# Patient Record
Sex: Female | Born: 1980 | Race: Asian | Hispanic: No | Marital: Single | Smoking: Never smoker
Health system: Southern US, Community
[De-identification: ages and names within clinical notes are randomized; demographics above are authoritative.]

## PROBLEM LIST (undated history)

## (undated) DIAGNOSIS — M329 Systemic lupus erythematosus, unspecified: Secondary | ICD-10-CM

## (undated) DIAGNOSIS — I10 Essential (primary) hypertension: Secondary | ICD-10-CM

## (undated) DIAGNOSIS — M359 Systemic involvement of connective tissue, unspecified: Secondary | ICD-10-CM

---

## 2004-02-01 ENCOUNTER — Emergency Department (HOSPITAL_COMMUNITY): Admission: EM | Admit: 2004-02-01 | Discharge: 2004-02-01 | Payer: Self-pay | Admitting: Emergency Medicine

## 2004-05-27 ENCOUNTER — Emergency Department (HOSPITAL_COMMUNITY): Admission: EM | Admit: 2004-05-27 | Discharge: 2004-05-27 | Payer: Self-pay | Admitting: Emergency Medicine

## 2004-06-14 ENCOUNTER — Ambulatory Visit: Payer: Self-pay | Admitting: *Deleted

## 2004-06-14 ENCOUNTER — Ambulatory Visit: Payer: Self-pay | Admitting: Internal Medicine

## 2004-06-14 ENCOUNTER — Ambulatory Visit: Payer: Self-pay | Admitting: Family Medicine

## 2004-06-21 ENCOUNTER — Ambulatory Visit: Payer: Self-pay | Admitting: Family Medicine

## 2004-06-27 ENCOUNTER — Ambulatory Visit (HOSPITAL_COMMUNITY): Admission: RE | Admit: 2004-06-27 | Discharge: 2004-06-27 | Payer: Self-pay | Admitting: Family Medicine

## 2004-07-06 ENCOUNTER — Ambulatory Visit: Payer: Self-pay | Admitting: Family Medicine

## 2004-07-20 ENCOUNTER — Ambulatory Visit: Payer: Self-pay | Admitting: Family Medicine

## 2004-07-22 ENCOUNTER — Encounter (INDEPENDENT_AMBULATORY_CARE_PROVIDER_SITE_OTHER): Payer: Self-pay | Admitting: Family Medicine

## 2004-07-22 LAB — CONVERTED CEMR LAB: Pap Smear: NORMAL

## 2004-09-19 ENCOUNTER — Ambulatory Visit: Payer: Self-pay | Admitting: Family Medicine

## 2004-10-10 ENCOUNTER — Ambulatory Visit: Payer: Self-pay | Admitting: Family Medicine

## 2005-02-06 ENCOUNTER — Inpatient Hospital Stay (HOSPITAL_COMMUNITY): Admission: AD | Admit: 2005-02-06 | Discharge: 2005-02-06 | Payer: Self-pay | Admitting: *Deleted

## 2005-03-14 ENCOUNTER — Ambulatory Visit: Admission: RE | Admit: 2005-03-14 | Discharge: 2005-03-14 | Payer: Self-pay | Admitting: *Deleted

## 2005-03-15 ENCOUNTER — Ambulatory Visit: Payer: Self-pay | Admitting: *Deleted

## 2005-04-05 ENCOUNTER — Ambulatory Visit: Payer: Self-pay | Admitting: Family Medicine

## 2005-04-19 ENCOUNTER — Ambulatory Visit: Payer: Self-pay | Admitting: *Deleted

## 2005-05-10 ENCOUNTER — Ambulatory Visit: Payer: Self-pay | Admitting: Family Medicine

## 2005-05-19 ENCOUNTER — Ambulatory Visit (HOSPITAL_COMMUNITY): Admission: RE | Admit: 2005-05-19 | Discharge: 2005-05-19 | Payer: Self-pay | Admitting: *Deleted

## 2005-05-24 ENCOUNTER — Ambulatory Visit: Payer: Self-pay | Admitting: *Deleted

## 2005-05-24 ENCOUNTER — Ambulatory Visit (HOSPITAL_COMMUNITY): Admission: RE | Admit: 2005-05-24 | Discharge: 2005-05-24 | Payer: Self-pay | Admitting: *Deleted

## 2005-05-30 ENCOUNTER — Ambulatory Visit: Payer: Self-pay | Admitting: *Deleted

## 2005-05-31 ENCOUNTER — Ambulatory Visit: Payer: Self-pay | Admitting: *Deleted

## 2005-06-06 ENCOUNTER — Ambulatory Visit (HOSPITAL_COMMUNITY): Admission: RE | Admit: 2005-06-06 | Discharge: 2005-06-06 | Payer: Self-pay | Admitting: Obstetrics and Gynecology

## 2005-06-07 ENCOUNTER — Ambulatory Visit: Payer: Self-pay | Admitting: *Deleted

## 2005-06-14 ENCOUNTER — Ambulatory Visit: Payer: Self-pay | Admitting: *Deleted

## 2005-06-28 ENCOUNTER — Ambulatory Visit: Payer: Self-pay | Admitting: Obstetrics & Gynecology

## 2005-07-12 ENCOUNTER — Ambulatory Visit: Payer: Self-pay | Admitting: Obstetrics & Gynecology

## 2005-07-19 ENCOUNTER — Ambulatory Visit: Payer: Self-pay | Admitting: *Deleted

## 2005-07-26 ENCOUNTER — Ambulatory Visit: Payer: Self-pay | Admitting: *Deleted

## 2005-07-26 ENCOUNTER — Ambulatory Visit (HOSPITAL_COMMUNITY): Admission: RE | Admit: 2005-07-26 | Discharge: 2005-07-26 | Payer: Self-pay | Admitting: *Deleted

## 2005-07-30 ENCOUNTER — Ambulatory Visit: Payer: Self-pay | Admitting: Obstetrics and Gynecology

## 2005-07-30 ENCOUNTER — Inpatient Hospital Stay (HOSPITAL_COMMUNITY): Admission: AD | Admit: 2005-07-30 | Discharge: 2005-08-01 | Payer: Self-pay | Admitting: *Deleted

## 2006-04-16 ENCOUNTER — Emergency Department (HOSPITAL_COMMUNITY): Admission: EM | Admit: 2006-04-16 | Discharge: 2006-04-16 | Payer: Self-pay | Admitting: Emergency Medicine

## 2006-06-07 ENCOUNTER — Ambulatory Visit: Payer: Self-pay | Admitting: Family Medicine

## 2006-06-24 IMAGING — CR DG FEET 3 VIEWS BILAT
6 series · 6 of 6 positions shown · non-contrast
Comparison: none

CLINICAL DATA: Pain without trauma.  
 LEFT FOOT (THREE VIEWS)

[view not recorded (1 of 6)]
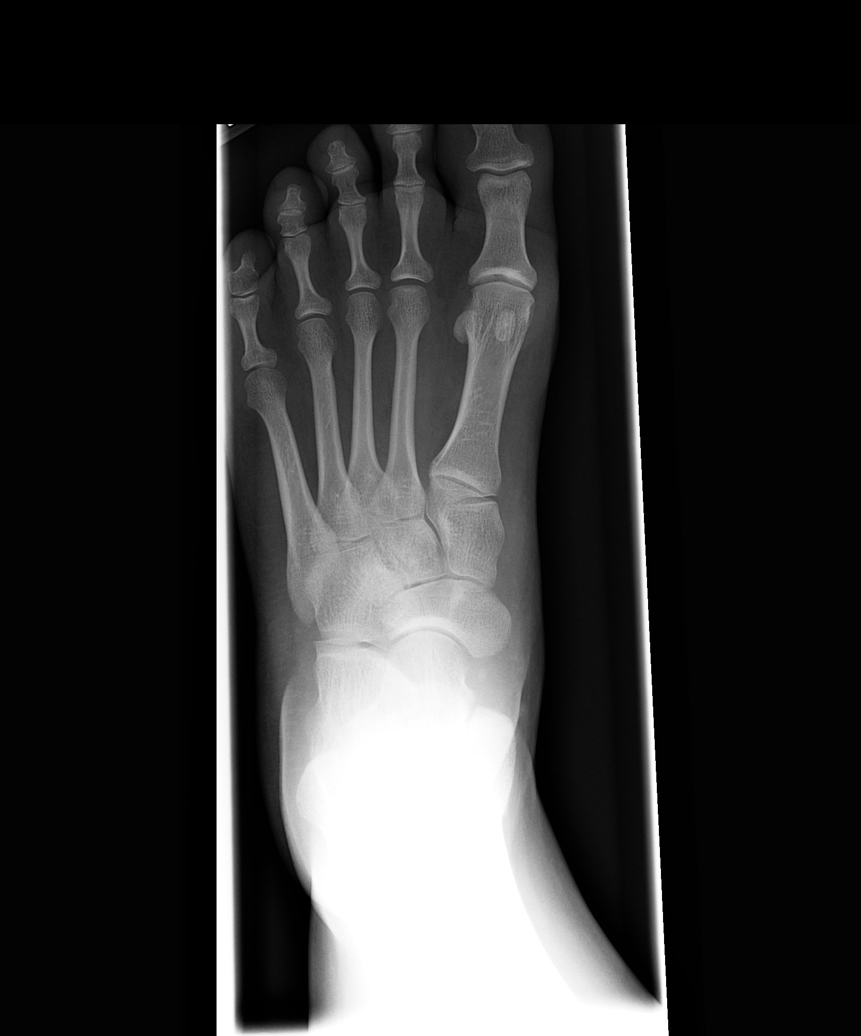

[view not recorded (2 of 6)]
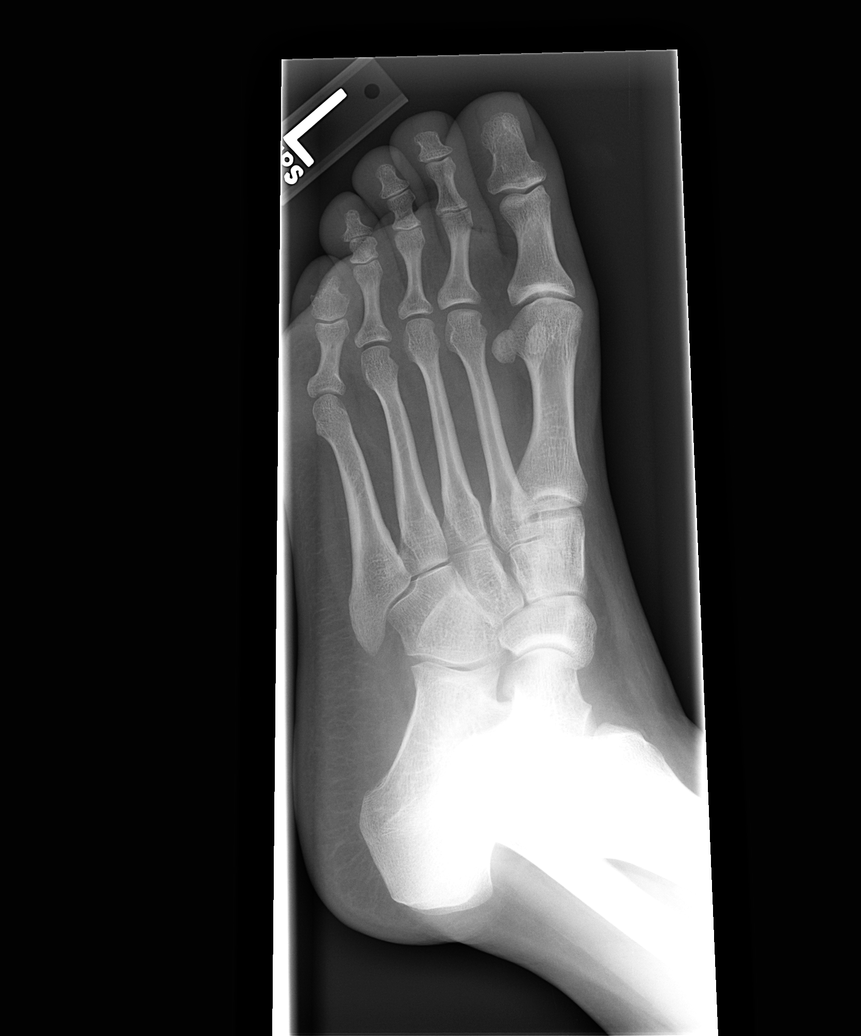

[view not recorded (3 of 6)]
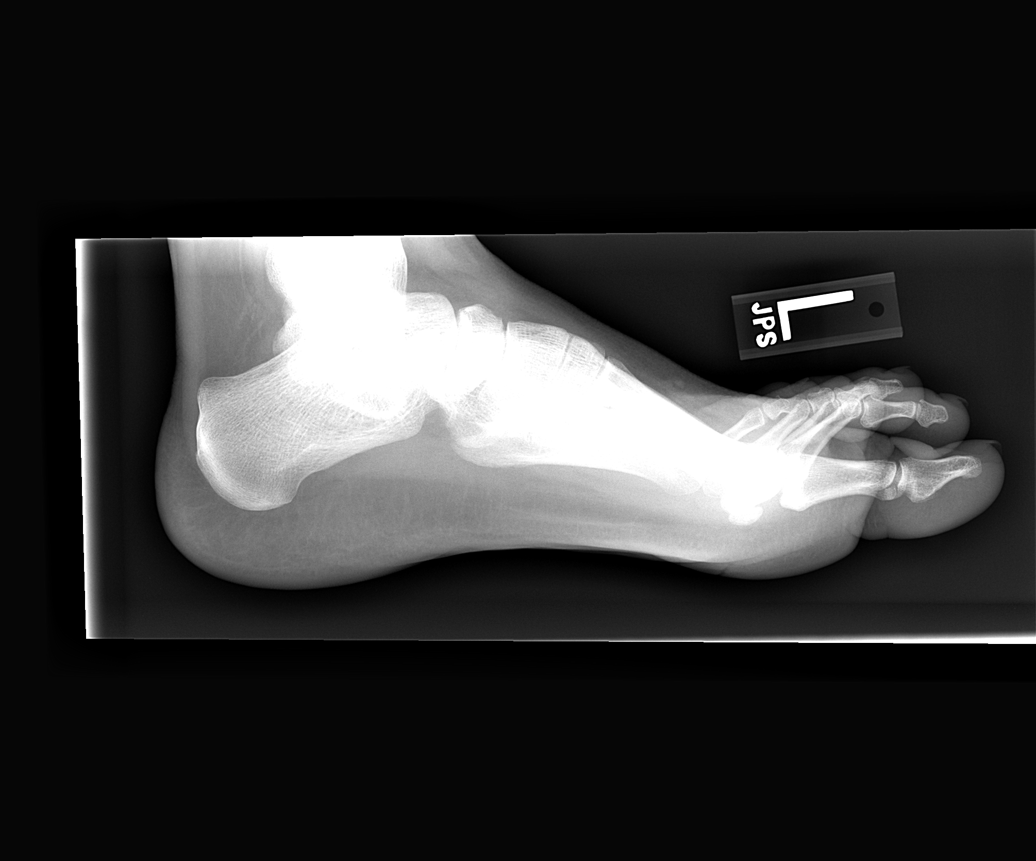

[view not recorded (4 of 6)]
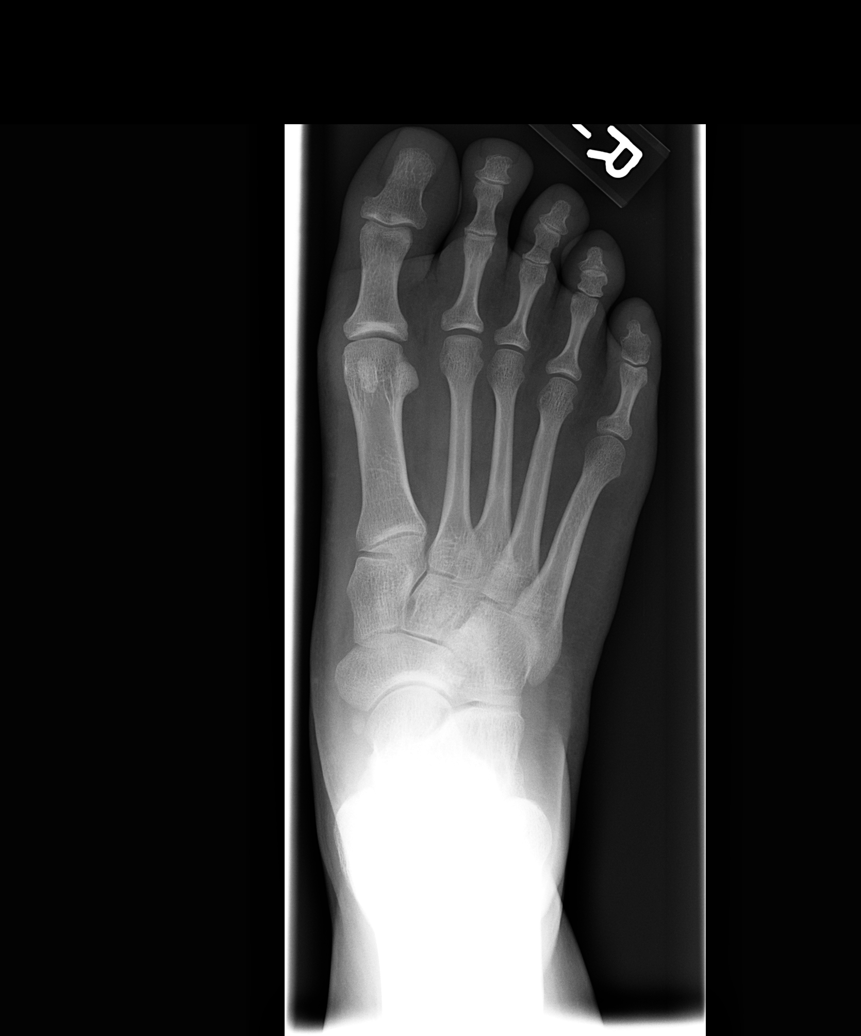

[view not recorded (5 of 6)]
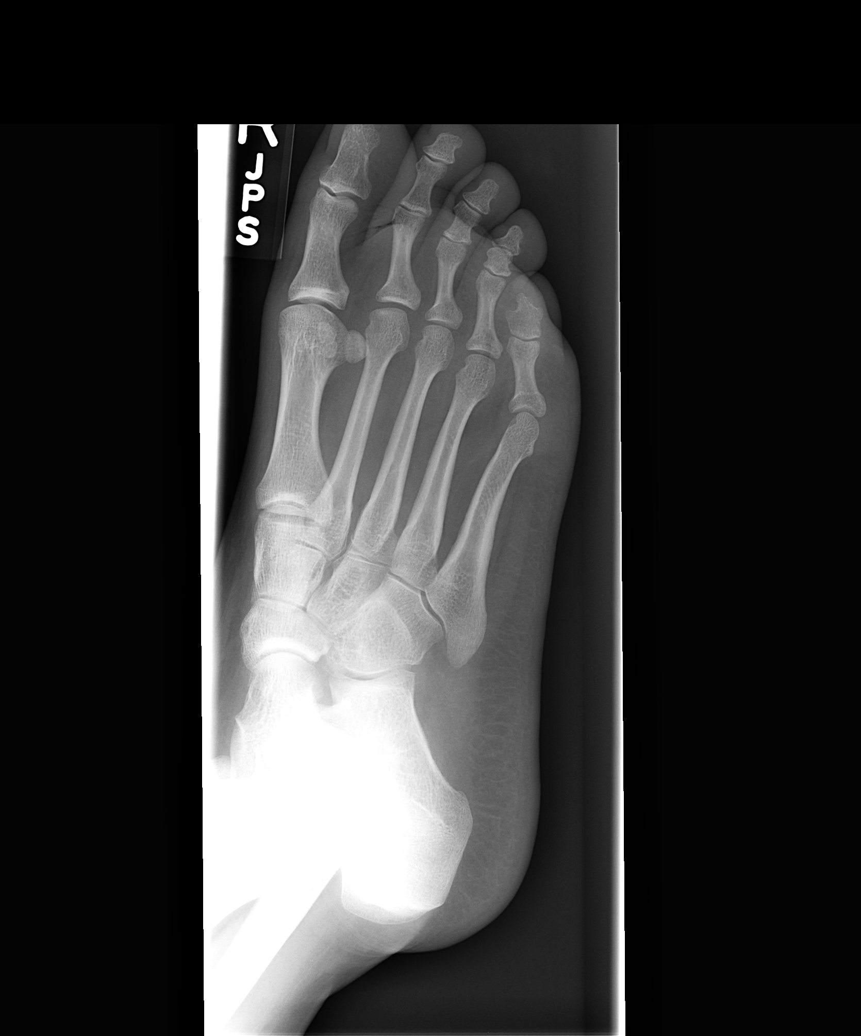

[view not recorded (6 of 6)]
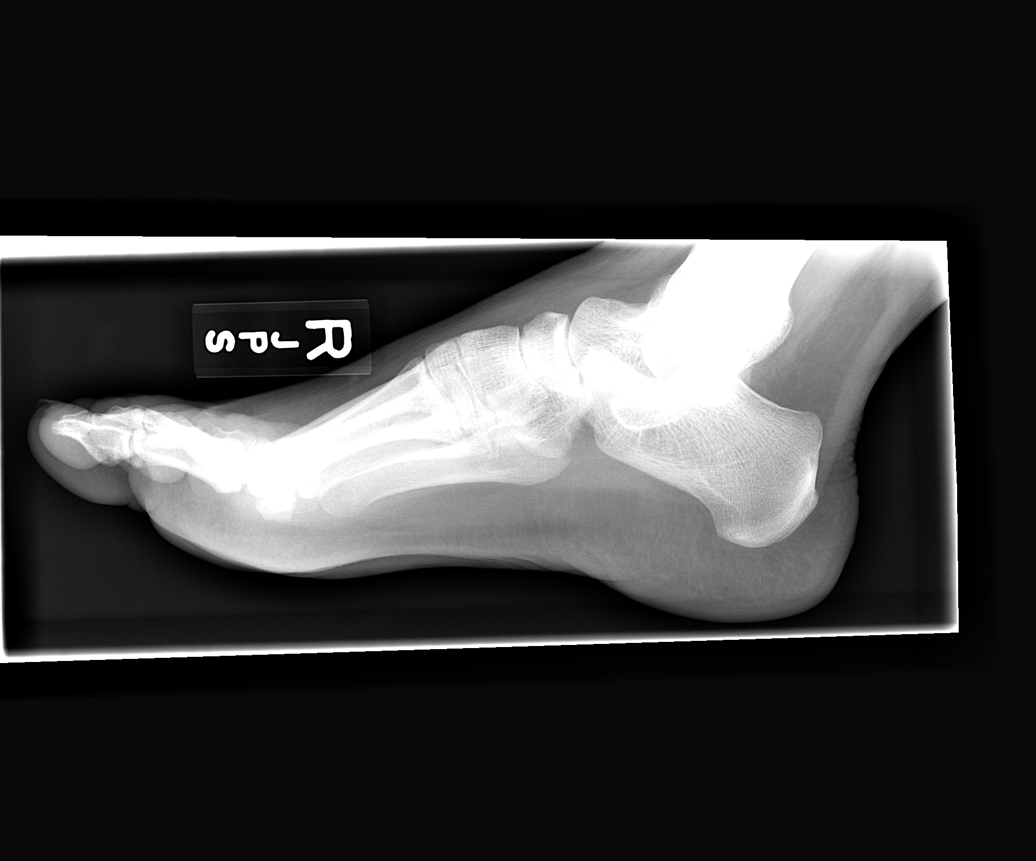

[6 of 6 positions shown; findings below may reference images not displayed]

FINDINGS: There is no evidence of fracture or dislocation.  No other significant bone or soft tissue abnormalities are identified.  The joint spaces are within normal limits.  
 IMPRESSION
 Normal study.    
 RIGHT FOOT (THREE VIEWS)
 There is no evidence of fracture or dislocation. No other significant bone or soft tissue abnormalities are identified. The joints spaces are within normal limits.  
 IMPRESSION
 Normal study.

## 2006-07-11 ENCOUNTER — Ambulatory Visit: Payer: Self-pay | Admitting: Family Medicine

## 2006-08-27 ENCOUNTER — Ambulatory Visit: Payer: Self-pay | Admitting: Family Medicine

## 2007-05-01 ENCOUNTER — Encounter (INDEPENDENT_AMBULATORY_CARE_PROVIDER_SITE_OTHER): Payer: Self-pay | Admitting: Family Medicine

## 2007-10-23 ENCOUNTER — Encounter (INDEPENDENT_AMBULATORY_CARE_PROVIDER_SITE_OTHER): Payer: Self-pay | Admitting: Family Medicine

## 2007-11-26 ENCOUNTER — Ambulatory Visit (HOSPITAL_COMMUNITY): Admission: RE | Admit: 2007-11-26 | Discharge: 2007-11-26 | Payer: Self-pay | Admitting: Family Medicine

## 2008-02-11 ENCOUNTER — Encounter (INDEPENDENT_AMBULATORY_CARE_PROVIDER_SITE_OTHER): Payer: Self-pay | Admitting: Family Medicine

## 2008-03-13 ENCOUNTER — Emergency Department (HOSPITAL_COMMUNITY): Admission: EM | Admit: 2008-03-13 | Discharge: 2008-03-13 | Payer: Self-pay

## 2008-03-13 ENCOUNTER — Emergency Department (HOSPITAL_COMMUNITY): Admission: EM | Admit: 2008-03-13 | Discharge: 2008-03-13 | Payer: Self-pay | Admitting: Emergency Medicine

## 2008-06-29 ENCOUNTER — Encounter (INDEPENDENT_AMBULATORY_CARE_PROVIDER_SITE_OTHER): Payer: Self-pay | Admitting: Family Medicine

## 2008-06-29 ENCOUNTER — Telehealth (INDEPENDENT_AMBULATORY_CARE_PROVIDER_SITE_OTHER): Payer: Self-pay | Admitting: *Deleted

## 2008-07-07 ENCOUNTER — Encounter (INDEPENDENT_AMBULATORY_CARE_PROVIDER_SITE_OTHER): Payer: Self-pay | Admitting: *Deleted

## 2011-05-19 LAB — URINALYSIS, ROUTINE W REFLEX MICROSCOPIC
Bilirubin Urine: NEGATIVE
Glucose, UA: NEGATIVE
Hgb urine dipstick: NEGATIVE
Ketones, ur: NEGATIVE
Nitrite: NEGATIVE
Protein, ur: NEGATIVE
Specific Gravity, Urine: 1.024
Urobilinogen, UA: 0.2
pH: 5.5

## 2011-05-19 LAB — DIFFERENTIAL
Basophils Absolute: 0
Basophils Relative: 0
Eosinophils Absolute: 0
Eosinophils Relative: 0
Lymphocytes Relative: 37
Lymphs Abs: 2.4
Monocytes Absolute: 0.4
Monocytes Relative: 6
Neutro Abs: 3.7
Neutrophils Relative %: 57

## 2011-05-19 LAB — CBC
HCT: 39.4
Hemoglobin: 12.8
MCHC: 32.4
MCV: 79.4
Platelets: 187
RBC: 4.96
RDW: 13.9
WBC: 6.5

## 2011-05-19 LAB — POCT I-STAT, CHEM 8
BUN: 13
Calcium, Ion: 1.09 — ABNORMAL LOW
Chloride: 106
Creatinine, Ser: 0.9
Glucose, Bld: 79
HCT: 42
Hemoglobin: 14.3
Potassium: 3.2 — ABNORMAL LOW
Sodium: 140
TCO2: 24

## 2011-05-19 LAB — POCT PREGNANCY, URINE
Operator id: 234501
Preg Test, Ur: NEGATIVE

## 2011-05-19 LAB — D-DIMER, QUANTITATIVE: D-Dimer, Quant: 0.3

## 2014-10-21 DIAGNOSIS — M329 Systemic lupus erythematosus, unspecified: Secondary | ICD-10-CM | POA: Insufficient documentation

## 2015-01-23 ENCOUNTER — Emergency Department
Admission: EM | Admit: 2015-01-23 | Discharge: 2015-01-23 | Disposition: A | Discharge status: Other Acute Inpatient Hospital | Payer: Self-pay | Attending: Student | Admitting: Student

## 2015-01-23 ENCOUNTER — Encounter: Payer: Self-pay | Admitting: Emergency Medicine

## 2015-01-23 ENCOUNTER — Emergency Department: Payer: Self-pay

## 2015-01-23 DIAGNOSIS — Z7952 Long term (current) use of systemic steroids: Secondary | ICD-10-CM | POA: Insufficient documentation

## 2015-01-23 DIAGNOSIS — Y834 Other reconstructive surgery as the cause of abnormal reaction of the patient, or of later complication, without mention of misadventure at the time of the procedure: Secondary | ICD-10-CM | POA: Insufficient documentation

## 2015-01-23 DIAGNOSIS — N9961 Intraoperative hemorrhage and hematoma of a genitourinary system organ or structure complicating a genitourinary system procedure: Secondary | ICD-10-CM | POA: Insufficient documentation

## 2015-01-23 DIAGNOSIS — Z79899 Other long term (current) drug therapy: Secondary | ICD-10-CM | POA: Insufficient documentation

## 2015-01-23 DIAGNOSIS — R109 Unspecified abdominal pain: Secondary | ICD-10-CM

## 2015-01-23 DIAGNOSIS — S37012A Minor contusion of left kidney, initial encounter: Secondary | ICD-10-CM

## 2015-01-23 DIAGNOSIS — Z9889 Other specified postprocedural states: Secondary | ICD-10-CM | POA: Insufficient documentation

## 2015-01-23 HISTORY — DX: Systemic lupus erythematosus, unspecified: M32.9

## 2015-01-23 LAB — CBC WITH DIFFERENTIAL/PLATELET
Basophils Absolute: 0 10*3/uL (ref 0–0.1)
Basophils Relative: 0 %
Eosinophils Absolute: 0 10*3/uL (ref 0–0.7)
Eosinophils Relative: 0 %
HCT: 33.5 % — ABNORMAL LOW (ref 35.0–47.0)
Hemoglobin: 10.6 g/dL — ABNORMAL LOW (ref 12.0–16.0)
Lymphocytes Relative: 9 %
Lymphs Abs: 0.7 10*3/uL — ABNORMAL LOW (ref 1.0–3.6)
MCH: 23.9 pg — ABNORMAL LOW (ref 26.0–34.0)
MCHC: 31.7 g/dL — ABNORMAL LOW (ref 32.0–36.0)
MCV: 75.3 fL — ABNORMAL LOW (ref 80.0–100.0)
Monocytes Absolute: 0.2 10*3/uL (ref 0.2–0.9)
Monocytes Relative: 3 %
Neutro Abs: 6.6 10*3/uL — ABNORMAL HIGH (ref 1.4–6.5)
Neutrophils Relative %: 88 %
Platelets: 144 10*3/uL — ABNORMAL LOW (ref 150–440)
RBC: 4.45 MIL/uL (ref 3.80–5.20)
RDW: 16.2 % — ABNORMAL HIGH (ref 11.5–14.5)
WBC: 7.5 10*3/uL (ref 3.6–11.0)

## 2015-01-23 LAB — COMPREHENSIVE METABOLIC PANEL
ALBUMIN: 3.9 g/dL (ref 3.5–5.0)
ALT: 24 U/L (ref 14–54)
AST: 32 U/L (ref 15–41)
Alkaline Phosphatase: 64 U/L (ref 38–126)
Anion gap: 8 (ref 5–15)
BUN: 19 mg/dL (ref 6–20)
CALCIUM: 8.9 mg/dL (ref 8.9–10.3)
CO2: 24 mmol/L (ref 22–32)
Chloride: 102 mmol/L (ref 101–111)
Creatinine, Ser: 1.16 mg/dL — ABNORMAL HIGH (ref 0.44–1.00)
GFR calc Af Amer: >60 mL/min (ref >60)
GFR calc non Af Amer: >60 mL/min (ref >60)
GLUCOSE: 99 mg/dL (ref 65–99)
Potassium: 4.2 mmol/L (ref 3.5–5.1)
Sodium: 134 mmol/L — ABNORMAL LOW (ref 135–145)
Total Bilirubin: 0.8 mg/dL (ref 0.3–1.2)
Total Protein: 9.1 g/dL — ABNORMAL HIGH (ref 6.5–8.1)

## 2015-01-23 LAB — URINALYSIS COMPLETE WITH MICROSCOPIC (ARMC ONLY)
Bilirubin Urine: NEGATIVE
Glucose, UA: NEGATIVE mg/dL
Ketones, ur: NEGATIVE mg/dL
Leukocytes, UA: NEGATIVE
NITRITE: NEGATIVE
PH: 7 (ref 5.0–8.0)
Specific Gravity, Urine: 1.018 (ref 1.005–1.030)

## 2015-01-23 LAB — LIPASE, BLOOD: Lipase: 32 U/L (ref 22–51)

## 2015-01-23 LAB — PREGNANCY, URINE: PREG TEST UR: NEGATIVE

## 2015-01-23 MED ORDER — SODIUM CHLORIDE 0.9 % IV BOLUS (SEPSIS)
1000.0000 mL | Freq: Once | INTRAVENOUS | Status: AC
  Administered 2015-01-23: 1000 mL via INTRAVENOUS

## 2015-01-23 MED ORDER — HYDROMORPHONE HCL 1 MG/ML IJ SOLN
INTRAMUSCULAR | Status: AC
  Administered 2015-01-23: 1 mg via INTRAVENOUS
  Filled 2015-01-23: qty 1

## 2015-01-23 MED ORDER — MORPHINE SULFATE 4 MG/ML IJ SOLN
4.0000 mg | Freq: Once | INTRAMUSCULAR | Status: AC
  Administered 2015-01-23: 4 mg via INTRAVENOUS

## 2015-01-23 MED ORDER — IOHEXOL 240 MG/ML SOLN
25.0000 mL | Freq: Once | INTRAMUSCULAR | Status: AC | PRN
  Administered 2015-01-23: 25 mL via ORAL

## 2015-01-23 MED ORDER — HYDROMORPHONE HCL 1 MG/ML IJ SOLN
1.0000 mg | Freq: Once | INTRAMUSCULAR | Status: AC
  Administered 2015-01-23: 1 mg via INTRAVENOUS

## 2015-01-23 MED ORDER — ONDANSETRON HCL 4 MG/2ML IJ SOLN
INTRAMUSCULAR | Status: AC
  Administered 2015-01-23: 4 mg via INTRAVENOUS
  Filled 2015-01-23: qty 2

## 2015-01-23 MED ORDER — MORPHINE SULFATE 4 MG/ML IJ SOLN
INTRAMUSCULAR | Status: AC
  Administered 2015-01-23: 4 mg via INTRAVENOUS
  Filled 2015-01-23: qty 1

## 2015-01-23 MED ORDER — IOHEXOL 300 MG/ML  SOLN
100.0000 mL | Freq: Once | INTRAMUSCULAR | Status: AC | PRN
  Administered 2015-01-23: 85 mL via INTRAVENOUS

## 2015-01-23 MED ORDER — ONDANSETRON HCL 4 MG/2ML IJ SOLN
4.0000 mg | Freq: Once | INTRAMUSCULAR | Status: AC
  Administered 2015-01-23: 4 mg via INTRAVENOUS

## 2015-01-23 NOTE — ED Notes (Signed)
Patient transported to CT 

## 2015-01-23 NOTE — ED Provider Notes (Signed)
[facility] Emergency Department Provider Note  ____________________________________________  Time seen: Approximately 4:17 PM  I have reviewed the triage vital signs and the nursing notes.   HISTORY  Chief Complaint Flank Pain    HPI Angela Li is a 34 y.o. female with history of SLE, chronic LFT elevation, status post left renal biopsy on 4/0/9811 complicated by renal hematoma at [facility] who presents with sudden onset left flank and left abdominal pain this morning. Pain has been constant since onset. It is described as dull. It is associated with nausea. Movement makes the pain worse. No vomiting or diarrhea. No fevers or chills. Chest pain or difficulty breathing. Currently her pain is moderate.   Past Medical History  Diagnosis Date  . Lupus     There are no active problems to display for this patient.   History reviewed. No pertinent past surgical history.  Current Outpatient Rx  Name  Route  Sig  Dispense  Refill  . acetaminophen (TYLENOL) 325 MG tablet   Oral   Take 650 mg by mouth every 4 (four) hours as needed.         . Cholecalciferol (VITAMIN D3) 1000 UNITS CAPS   Oral   Take 1,000 Int'l Units by mouth daily.         . hydroxychloroquine (PLAQUENIL) 200 MG tablet   Oral   Take 200 mg by mouth 2 (two) times daily.         . irbesartan (AVAPRO) 150 MG tablet   Oral   Take 75 mg by mouth at bedtime. Start 1 week after kidney biopsy         . naproxen sodium (ANAPROX) 220 MG tablet   Oral   Take 440 mg by mouth every 6 (six) hours as needed (for pain.).         Marland Kitchen pantoprazole (PROTONIX) 40 MG tablet   Oral   Take 40 mg by mouth daily as needed.         . predniSONE (DELTASONE) 5 MG tablet   Oral   Take 5 mg by mouth daily.           Allergies Review of patient's allergies indicates no known allergies.  History reviewed. No pertinent family history.  Social History History  Substance Use Topics  . Smoking  status: Never Smoker   . Smokeless tobacco: Not on file  . Alcohol Use: No    Review of Systems Constitutional: No fever/chills Eyes: No visual changes. ENT: No sore throat. Cardiovascular: Denies chest pain. Respiratory: Denies shortness of breath. Gastrointestinal: + abdominal pain.  +nausea, no vomiting.  No diarrhea.  No constipation. Genitourinary: Negative for dysuria. Musculoskeletal: + for left flank pain. Skin: Negative for rash. Neurological: Negative for headaches, focal weakness or numbness.  10-point ROS otherwise negative.  ____________________________________________   PHYSICAL EXAM:  VITAL SIGNS: ED Triage Vitals  Enc Vitals Group     BP --      Pulse Rate 01/23/15 1223 81     Resp 01/23/15 1223 20     Temp 01/23/15 1223 97.8 F (36.6 C)     Temp Source 01/23/15 1223 Oral     SpO2 01/23/15 1223 100 %     Weight 01/23/15 1223 151 lb (68.493 kg)     Height 01/23/15 1223 _0  (1.651 m)     Head Cir --      Peak Flow --      Pain Score 01/23/15 1224 10  Pain Loc --      Pain Edu? --      Excl. in [place]? --     Constitutional: Alert and oriented. Well appearing and in no acute distress until she moves at which point she appears to be in pain. Eyes: Conjunctivae are normal. PERRL. EOMI. Head: Atraumatic. Nose: No congestion/rhinnorhea. Mouth/Throat: Mucous membranes are moist.  Oropharynx non-erythematous. Neck: No stridor.   Cardiovascular: Normal rate, regular rhythm. Grossly normal heart sounds.  Good peripheral circulation. Respiratory: Normal respiratory effort.  No retractions. Lungs CTAB. Gastrointestinal: Soft with moderate to severe left lower quadrant and left mid abdominal tenderness. No distention. No abdominal bruits. Small bruise with healed small caliber puncture wound from needle biopsy in the left flank. Genitourinary: deferred Musculoskeletal: No lower extremity tenderness nor edema.  No joint effusions. Neurologic:  Normal speech  and language. No gross focal neurologic deficits are appreciated. Speech is normal. No gait instability. Skin:  Skin is warm, dry and intact. No rash noted. Psychiatric: Mood and affect are normal. Speech and behavior are normal.  ____________________________________________   LABS (all labs ordered are listed, but only abnormal results are displayed)  Labs Reviewed  CBC WITH DIFFERENTIAL/PLATELET - Abnormal; Notable for the following:    Hemoglobin 10.6 (*)    HCT 33.5 (*)    MCV 75.3 (*)    MCH 23.9 (*)    MCHC 31.7 (*)    RDW 16.2 (*)    Platelets 144 (*)    Neutro Abs 6.6 (*)    Lymphs Abs 0.7 (*)    All other components within normal limits  COMPREHENSIVE METABOLIC PANEL - Abnormal; Notable for the following:    Sodium 134 (*)    Creatinine, Ser 1.16 (*)    Total Protein 9.1 (*)    All other components within normal limits  URINALYSIS COMPLETEWITH MICROSCOPIC ([facility] ONLY) - Abnormal; Notable for the following:    Color, Urine YELLOW (*)    APPearance CLEAR (*)    Hgb urine dipstick 1+ (*)    Protein, ur >500 (*)    Bacteria, UA RARE (*)    Squamous Epithelial / LPF 0-5 (*)    All other components within normal limits  LIPASE, BLOOD  PREGNANCY, URINE   ____________________________________________  EKG  none ____________________________________________  RADIOLOGY  CT Abdomen and Pelvis IMPRESSION: Large subcapsular hematoma LEFT kidney 7.9 x 7.6 x 10.9 cm in size with additional blood in the LEFT perinephric and posterior para renal spaces. ____________________________________________   PROCEDURES  Procedure(s) performed: None  Critical Care performed: No  ____________________________________________   INITIAL IMPRESSION / ASSESSMENT AND PLAN / ED COURSE  Pertinent labs & imaging results that were available during my care of the patient were reviewed by me and considered in my medical decision making (see chart for details).  Angela Li is a 34  y.o. female with history of SLE, chronic LFT elevation, status post left renal biopsy on 03/24/4195 complicated by renal hematoma at [facility] who presents with sudden onset left flank and left abdominal pain this morning. On exam, vital signs stable and she is afebrile. Labs compared to labs obtained at [facility] 2 days ago show stable hemoglobin at approximately 10. As she has a known renal hematoma and given new onset left lower quadrant tenderness, we'll obtain CT of the abdomen and pelvis to evaluate for any additional complication associated with the patient's biopsy. We'll give IV fluids, treat her pain.  ----------------------------------------- 8:00 PM on 01/23/2015 -----------------------------------------  CT  scan shows hematoma which has increased in size when compared to ultrasound on 01/21/2015. Discussed with patient need for admission and she agrees, the best course of care is to send her back to [facility]. Pain is improving with Dilaudid. Vital signs stable. Discussed with Dr .Johnney Ou of Nephrology who recommended ER to ER transfer. Discussed with Dr. Patrici Ranks, ER attending who will accept transfer. ____________________________________________   FINAL CLINICAL IMPRESSION(S) / ED DIAGNOSES  Final diagnoses:  Renal hematoma, left, initial encounter  Flank pain      Joanne Gavel, MD 01/23/15 2004

## 2015-01-23 NOTE — ED Notes (Signed)
Report to Marguarite Arbour, Insurance risk surveyor Nurse at [facility] ER.

## 2015-01-23 NOTE — ED Notes (Signed)
Kidney biopsy on Wednesday.  Right flank pain onset this am

## 2015-01-25 DIAGNOSIS — D696 Thrombocytopenia, unspecified: Secondary | ICD-10-CM | POA: Insufficient documentation

## 2016-09-01 ENCOUNTER — Encounter: Payer: Self-pay | Admitting: Emergency Medicine

## 2016-09-01 ENCOUNTER — Emergency Department: Payer: Medicaid Other

## 2016-09-01 ENCOUNTER — Inpatient Hospital Stay
Admission: EM | Admit: 2016-09-01 | Discharge: 2016-09-02 | DRG: 546 | Disposition: A | Discharge status: Other Acute Inpatient Hospital | Payer: Medicaid Other | Attending: Internal Medicine | Admitting: Internal Medicine

## 2016-09-01 ENCOUNTER — Observation Stay
Admit: 2016-09-01 | Discharge: 2016-09-01 | Disposition: A | Discharge status: Home / Self Care | Payer: Medicaid Other | Attending: Internal Medicine | Admitting: Internal Medicine

## 2016-09-01 DIAGNOSIS — M3214 Glomerular disease in systemic lupus erythematosus: Secondary | ICD-10-CM | POA: Diagnosis present

## 2016-09-01 DIAGNOSIS — E872 Acidosis: Secondary | ICD-10-CM | POA: Diagnosis present

## 2016-09-01 DIAGNOSIS — N179 Acute kidney failure, unspecified: Secondary | ICD-10-CM | POA: Diagnosis present

## 2016-09-01 DIAGNOSIS — Z79899 Other long term (current) drug therapy: Secondary | ICD-10-CM

## 2016-09-01 DIAGNOSIS — I272 Pulmonary hypertension, unspecified: Secondary | ICD-10-CM | POA: Diagnosis present

## 2016-09-01 DIAGNOSIS — N19 Unspecified kidney failure: Secondary | ICD-10-CM | POA: Diagnosis present

## 2016-09-01 DIAGNOSIS — I10 Essential (primary) hypertension: Secondary | ICD-10-CM | POA: Diagnosis present

## 2016-09-01 DIAGNOSIS — R188 Other ascites: Secondary | ICD-10-CM | POA: Diagnosis present

## 2016-09-01 DIAGNOSIS — I309 Acute pericarditis, unspecified: Secondary | ICD-10-CM | POA: Diagnosis present

## 2016-09-01 DIAGNOSIS — M3212 Pericarditis in systemic lupus erythematosus: Secondary | ICD-10-CM

## 2016-09-01 DIAGNOSIS — E875 Hyperkalemia: Secondary | ICD-10-CM | POA: Diagnosis present

## 2016-09-01 DIAGNOSIS — J90 Pleural effusion, not elsewhere classified: Secondary | ICD-10-CM | POA: Diagnosis present

## 2016-09-01 DIAGNOSIS — I319 Disease of pericardium, unspecified: Secondary | ICD-10-CM

## 2016-09-01 HISTORY — DX: Essential (primary) hypertension: I10

## 2016-09-01 HISTORY — DX: Systemic involvement of connective tissue, unspecified: M35.9

## 2016-09-01 LAB — CBC
HCT: 47.5 % — ABNORMAL HIGH (ref 35.0–47.0)
Hemoglobin: 15.3 g/dL (ref 12.0–16.0)
MCH: 24.5 pg — ABNORMAL LOW (ref 26.0–34.0)
MCHC: 32.3 g/dL (ref 32.0–36.0)
MCV: 75.9 fL — AB (ref 80.0–100.0)
Platelets: 100 10*3/uL — ABNORMAL LOW (ref 150–440)
RBC: 6.26 MIL/uL — ABNORMAL HIGH (ref 3.80–5.20)
RDW: 25.2 % — ABNORMAL HIGH (ref 11.5–14.5)
WBC: 8.4 10*3/uL (ref 3.6–11.0)

## 2016-09-01 LAB — INFLUENZA PANEL BY PCR (TYPE A & B)
INFLBPCR: NEGATIVE
Influenza A By PCR: NEGATIVE

## 2016-09-01 LAB — ECHOCARDIOGRAM COMPLETE
HEIGHTINCHES: 65 in
Weight: 2395.2 oz

## 2016-09-01 LAB — BASIC METABOLIC PANEL
ANION GAP: 6 (ref 5–15)
BUN: 26 mg/dL — ABNORMAL HIGH (ref 6–20)
CO2: 17 mmol/L — ABNORMAL LOW (ref 22–32)
Calcium: 7.6 mg/dL — ABNORMAL LOW (ref 8.9–10.3)
Chloride: 113 mmol/L — ABNORMAL HIGH (ref 101–111)
Creatinine, Ser: 1.18 mg/dL — ABNORMAL HIGH (ref 0.44–1.00)
GFR calc Af Amer: >60 mL/min (ref >60)
GFR, EST NON AFRICAN AMERICAN: 59 mL/min — AB (ref >60)
GLUCOSE: 97 mg/dL (ref 65–99)
POTASSIUM: 5 mmol/L (ref 3.5–5.1)
Sodium: 136 mmol/L (ref 135–145)

## 2016-09-01 LAB — TROPONIN I
TROPONIN I: 0.14 ng/mL — AB (ref <0.03)
TROPONIN I: 0.13 ng/mL — AB (ref <0.03)
Troponin I: 0.07 ng/mL (ref <0.03)
Troponin I: 0.15 ng/mL (ref <0.03)

## 2016-09-01 MED ORDER — PROCHLORPERAZINE EDISYLATE 5 MG/ML IJ SOLN
5.0000 mg | INTRAMUSCULAR | Status: DC | PRN
  Administered 2016-09-02: 5 mg via INTRAVENOUS
  Filled 2016-09-01 (×2): qty 2

## 2016-09-01 MED ORDER — PREDNISONE 10 MG PO TABS
5.0000 mg | ORAL_TABLET | Freq: Two times a day (BID) | ORAL | Status: DC
  Administered 2016-09-01: 5 mg via ORAL
  Filled 2016-09-01: qty 2

## 2016-09-01 MED ORDER — ONDANSETRON HCL 4 MG/2ML IJ SOLN
INTRAMUSCULAR | Status: AC
Start: 2016-09-01 — End: 2016-09-01
  Administered 2016-09-01: 4 mg
  Filled 2016-09-01: qty 2

## 2016-09-01 MED ORDER — VITAMIN D 1000 UNITS PO TABS
1000.0000 [IU] | ORAL_TABLET | Freq: Every day | ORAL | Status: DC
  Administered 2016-09-01: 1000 [IU] via ORAL
  Filled 2016-09-01: qty 1

## 2016-09-01 MED ORDER — ASPIRIN 81 MG PO CHEW
CHEWABLE_TABLET | ORAL | Status: AC
  Administered 2016-09-01: 324 mg via ORAL
  Filled 2016-09-01: qty 4

## 2016-09-01 MED ORDER — PANTOPRAZOLE SODIUM 40 MG PO TBEC
40.0000 mg | DELAYED_RELEASE_TABLET | Freq: Two times a day (BID) | ORAL | Status: DC
  Administered 2016-09-01: 40 mg via ORAL
  Filled 2016-09-01 (×2): qty 1

## 2016-09-01 MED ORDER — IOPAMIDOL (ISOVUE-370) INJECTION 76%
75.0000 mL | Freq: Once | INTRAVENOUS | Status: AC | PRN
  Administered 2016-09-01: 75 mL via INTRAVENOUS

## 2016-09-01 MED ORDER — SODIUM CHLORIDE 0.9% FLUSH
3.0000 mL | Freq: Two times a day (BID) | INTRAVENOUS | Status: DC
  Administered 2016-09-01 – 2016-09-02 (×3): 3 mL via INTRAVENOUS

## 2016-09-01 MED ORDER — ASPIRIN 81 MG PO CHEW
324.0000 mg | CHEWABLE_TABLET | Freq: Once | ORAL | Status: AC
  Administered 2016-09-01: 324 mg via ORAL

## 2016-09-01 MED ORDER — ALBUTEROL SULFATE (2.5 MG/3ML) 0.083% IN NEBU
5.0000 mg | INHALATION_SOLUTION | Freq: Once | RESPIRATORY_TRACT | Status: AC
  Administered 2016-09-01: 5 mg via RESPIRATORY_TRACT
  Filled 2016-09-01: qty 6

## 2016-09-01 MED ORDER — INDOMETHACIN ER 75 MG PO CPCR
75.0000 mg | ORAL_CAPSULE | Freq: Two times a day (BID) | ORAL | Status: DC
  Administered 2016-09-01: 75 mg via ORAL
  Filled 2016-09-01 (×2): qty 1

## 2016-09-01 MED ORDER — KETOROLAC TROMETHAMINE 30 MG/ML IJ SOLN
30.0000 mg | Freq: Once | INTRAMUSCULAR | Status: AC
Start: 2016-09-01 — End: 2016-09-01
  Administered 2016-09-01: 30 mg via INTRAVENOUS
  Filled 2016-09-01 (×2): qty 1

## 2016-09-01 MED ORDER — ONDANSETRON HCL 4 MG/2ML IJ SOLN: 4.0000 mg | Freq: Four times a day (QID) | INTRAMUSCULAR | Status: DC | PRN

## 2016-09-01 MED ORDER — PANTOPRAZOLE SODIUM 40 MG IV SOLR
40.0000 mg | Freq: Once | INTRAVENOUS | Status: AC
  Administered 2016-09-01: 40 mg via INTRAVENOUS
  Filled 2016-09-01: qty 40

## 2016-09-01 MED ORDER — ONDANSETRON HCL 4 MG/2ML IJ SOLN
4.0000 mg | Freq: Four times a day (QID) | INTRAMUSCULAR | Status: DC
  Administered 2016-09-01: 4 mg via INTRAVENOUS

## 2016-09-01 MED ORDER — ACETAMINOPHEN 325 MG PO TABS: 650.0000 mg | ORAL_TABLET | ORAL | Status: DC | PRN

## 2016-09-01 MED ORDER — SODIUM CHLORIDE 0.9 % IV SOLN
250.0000 mg | Freq: Once | INTRAVENOUS | Status: AC
  Administered 2016-09-01: 250 mg via INTRAVENOUS
  Filled 2016-09-01: qty 2

## 2016-09-01 MED ORDER — MORPHINE SULFATE (PF) 4 MG/ML IV SOLN
4.0000 mg | Freq: Once | INTRAVENOUS | Status: AC
Start: 2016-09-01 — End: 2016-09-01
  Administered 2016-09-01: 4 mg via INTRAVENOUS
  Filled 2016-09-01: qty 1

## 2016-09-01 NOTE — ED Notes (Signed)
Responded to room 12 call out. Pt stated she didn't call and didn't need anything.

## 2016-09-01 NOTE — H&P (Signed)
[place] at [place] NAME: Angela Li    MR#:  [id number]  DATE OF BIRTH:  [birth year]  DATE OF ADMISSION:  09/01/2016  PRIMARY CARE PHYSICIAN: [facility]   REQUESTING/REFERRING PHYSICIAN: [email or web address]  CHIEF COMPLAINT:   Chief Complaint  Patient presents with  . Shortness of Breath    HISTORY OF PRESENT ILLNESS: Angela Li  is a 36 y.o. female with a known history of Lupus, hypertension- started having some shoulder and chest pain last month, when she visited her primary care he told it is likely secondary to her lupus and did not make any changes in her medications. Her condition continued to get worse and she was getting short of breath with minimal exertion and getting low-grade fever at nighttime. Concerned with this she called her primary care again and she brought appointment last Tuesday with him. During that appointment he told maybe she has pneumonia and gave her some antibiotic to take which she is taking for last 3 days where she continued to get worse and having extreme fatigue and shortness of breath with minimal exertion so she decided to come to emergency room. CT scan of the chest is negative for any pneumonia or pneumonia more eye but it shows slight pericardial effusion and the patient has some T-wave inversions on EKG so she was suspected to have pericarditis and given his admission to hospitalist team as well as ER physician spoke to cardiologist on phone.  PAST MEDICAL HISTORY:   Past Medical History:  Diagnosis Date  . Collagen vascular disease ([place])   . Hypertension   . Lupus     PAST SURGICAL HISTORY: History reviewed. No pertinent surgical history.  SOCIAL HISTORY:  Social History  Substance Use Topics  . Smoking status: Never Smoker  . Smokeless tobacco: Never Used  . Alcohol use No    FAMILY HISTORY:  Family History  Problem Relation Age of Onset  . Diabetes Father     DRUG ALLERGIES:  No Known Allergies  REVIEW OF SYSTEMS:   CONSTITUTIONAL: No fever,Positive for fatigue or weakness.  EYES: No blurred or double vision.  EARS, NOSE, AND THROAT: No tinnitus or ear pain.  RESPIRATORY: No cough, shortness of breath, wheezing or hemoptysis.  CARDIOVASCULAR: No chest pain, orthopnea, edema.  GASTROINTESTINAL: No nausea, vomiting, diarrhea or abdominal pain.  GENITOURINARY: No dysuria, hematuria.  ENDOCRINE: No polyuria, nocturia,  HEMATOLOGY: No anemia, easy bruising or bleeding SKIN: No rash or lesion. MUSCULOSKELETAL: No joint pain or arthritis.   NEUROLOGIC: No tingling, numbness, weakness.  PSYCHIATRY: No anxiety or depression.   MEDICATIONS AT HOME:  Prior to Admission medications   Medication Sig Start Date End Date Taking? Authorizing Provider  acetaminophen (TYLENOL) 325 MG tablet Take 650 mg by mouth every 4 (four) hours as needed.   Yes Historical Provider, MD  pantoprazole (PROTONIX) 40 MG tablet Take 40 mg by mouth 2 (two) times daily.    Yes Historical Provider, MD  predniSONE (DELTASONE) 5 MG tablet Take 5 mg by mouth 2 (two) times daily with a meal.  10/05/14  Yes Historical Provider, MD  Cholecalciferol (VITAMIN D3) 1000 UNITS CAPS Take 1,000 Int'l Units by mouth daily.    Historical Provider, MD      PHYSICAL EXAMINATION:   VITAL SIGNS: Blood pressure (!) 116/103, pulse (!) 113, temperature 98 F (36.7 C), temperature source Axillary, resp. rate (!) 30, height 5' 5" (1.651 m), weight 65.8 kg (145 lb),  last menstrual period 08/27/2016, SpO2 96 %.  GENERAL:  36 y.o.-year-old patient lying in the bed with no acute distress.  EYES: Pupils equal, round, reactive to light and accommodation. No scleral icterus. Extraocular muscles intact.  HEENT: Head atraumatic, normocephalic. Oropharynx and nasopharynx clear.  NECK:  Supple, no jugular venous distention. No thyroid enlargement, no tenderness.  LUNGS: Normal breath sounds bilaterally, no wheezing,  rales,rhonchi or crepitation. No use of accessory muscles of respiration.  CARDIOVASCULAR: S1, S2 normal. No murmurs, rubs, or gallops.  ABDOMEN: Soft, nontender, nondistended. Bowel sounds present. No organomegaly or mass.  EXTREMITIES: No pedal edema, cyanosis, or clubbing.  NEUROLOGIC: Cranial nerves II through XII are intact. Muscle strength 5/5 in all extremities. Sensation intact. Gait not checked.  PSYCHIATRIC: The patient is alert and oriented x 3.  SKIN: No obvious rash, lesion, or ulcer.   LABORATORY PANEL:   CBC  Recent Labs Lab 09/01/16 0352  WBC 8.4  HGB 15.3  HCT 47.5*  PLT 100*  MCV 75.9*  MCH 24.5*  MCHC 32.3  RDW 25.2*   ------------------------------------------------------------------------------------------------------------------  Chemistries   Recent Labs Lab 09/01/16 0352  NA 136  K 5.0  CL 113*  CO2 17*  GLUCOSE 97  BUN 26*  CREATININE 1.18*  CALCIUM 7.6*   ------------------------------------------------------------------------------------------------------------------ estimated creatinine clearance is 59.9 mL/min (by C-G formula based on SCr of 1.18 mg/dL (H)). ------------------------------------------------------------------------------------------------------------------ No results for input(s): TSH, T4TOTAL, T3FREE, THYROIDAB in the last 72 hours.  Invalid input(s): FREET3   Coagulation profile No results for input(s): INR, PROTIME in the last 168 hours. ------------------------------------------------------------------------------------------------------------------- No results for input(s): DDIMER in the last 72 hours. -------------------------------------------------------------------------------------------------------------------  Cardiac Enzymes  Recent Labs Lab 09/01/16 0352 09/01/16 0751  TROPONINI 0.07* 0.14*    ------------------------------------------------------------------------------------------------------------------ Invalid input(s): POCBNP  ---------------------------------------------------------------------------------------------------------------  Urinalysis    Component Value Date/Time   COLORURINE YELLOW (A) 01/23/2015 1227   APPEARANCEUR CLEAR (A) 01/23/2015 1227   LABSPEC 1.018 01/23/2015 1227   PHURINE 7.0 01/23/2015 1227   GLUCOSEU NEGATIVE 01/23/2015 1227   HGBUR 1+ (A) 01/23/2015 1227   BILIRUBINUR NEGATIVE 01/23/2015 1227   KETONESUR NEGATIVE 01/23/2015 1227   PROTEINUR >500 (A) 01/23/2015 1227   UROBILINOGEN 0.2 03/13/2008 1315   NITRITE NEGATIVE 01/23/2015 1227   LEUKOCYTESUR NEGATIVE 01/23/2015 1227     RADIOLOGY: Dg Chest 2 View  Result Date: 09/01/2016 CLINICAL DATA:  Shortness of breath and dizziness. History of lupus. EXAM: CHEST  2 VIEW COMPARISON:  Chest radiograph 03/13/2008 FINDINGS: There is increased, abnormal prominence of the left cardiac silhouette. No focal airspace consolidation or pulmonary edema. No pleural effusion or pneumothorax. IMPRESSION: 1. Increased prominence of the left cardiac silhouette may indicate underlying pulmonary hypertension. This has developed since the prior radiograph of 03/13/2008. Chest CT or echocardiography may be helpful for further assessment. 2. No radiographic evidence of pneumonia. Electronically Signed   By: Ulyses Jarred M.D.   On: 09/01/2016 04:10   Ct Angio Chest Pe W And/or Wo Contrast  Result Date: 09/01/2016 CLINICAL DATA:  36 year old female with malaise for 1 week. Increasing left side chest pain for 4 days with shortness of breath. Initial encounter. EXAM: CT ANGIOGRAPHY CHEST WITH CONTRAST TECHNIQUE: Multidetector CT imaging of the chest was performed using the standard protocol during bolus administration of intravenous contrast. Multiplanar CT image reconstructions and MIPs were obtained to evaluate the  vascular anatomy. CONTRAST:  75 mL Isovue 370 COMPARISON:  Chest radiographs 0358 hours today, and earlier. Prior chest CTA 02/01/2004. FINDINGS: Cardiovascular: Good  contrast bolus timing in the pulmonary arterial tree. There is enlargement of the central pulmonary artery, with a diameter estimated at 40 mm (versus about 30 mm diameter of the adjacent ascending aorta. Respiratory motion artifact at both lung lung bases obscures detail of the subsegmental lower lobe pulmonary arteries. There is otherwise no pulmonary artery filling defect to suggest pulmonary embolus. A small pericardial effusion is new, measuring up to 11 mm in thickness along the anterior surface of the heart (series 4, image 70). There is mild new cardiomegaly compared to 2005. Grossly negative aorta, less arterial contrast today compared to the 2005 study. Mediastinum/Nodes: No mediastinal lymphadenopathy. Axillary lymph nodes appear stable since 2005. Lungs/Pleura: Small layering left pleural effusion. Major airways are patent. There is mild gas trapping in the superior segment of the left lower lobe. There is no abnormal pulmonary opacity aside from mild left lower lobe atelectasis. Upper Abdomen: Trace or small volume ascites in the upper abdomen, most apparent along the liver contour (series 4, image 82). Otherwise negative visualized liver (small calcified granuloma in the right lobe series 4, image 88), gallbladder, spleen, adrenal glands, pancreas, and renal upper poles. Negative visible bowel in the upper abdomen. Musculoskeletal: No osseous abnormality identified. Review of the MIP images confirms the above findings. IMPRESSION: 1. Central pulmonary artery enlargement has developed since 2005 suggesting pulmonary artery hypertension, but there is no pulmonary artery filling defect to indicate pulmonary embolus - note that subsegmental lower lobe branches are obscured by respiratory motion. 2. Additionally there is a constellation of new  small pericardial effusion, a small layering left pleural effusion, and a small volume of ascites in the upper abdomen. These findings are nonspecific and could be seen with anasarca, but consider also an acute systemic inflammatory process (which would include pericarditis). There is also mild cardiomegaly compared to 2005. 3. Negative lungs except for mild left lower lobe areas of gas trapping and atelectasis. Electronically Signed   By: Genevie Ann M.D.   On: 09/01/2016 08:26    EKG: Orders placed or performed during the hospital encounter of 09/01/16  . ED EKG  . ED EKG  . EKG 12-Lead  . EKG 12-Lead    IMPRESSION AND PLAN:  * Acute pericarditis   Likely secondary to her lupus or it may be viral episode.   Started on indomethacin, check echocardiogram. Monitor on telemetry.   Cardiology consult for further management.   Patient is already taking steroids for her lupus, continue that.  * Elevated troponin   Likely secondary to her pericarditis, follow serial troponin and if it is rise significantly then we may need to speak to cardiologist about that.  * Lupus   Patient take prednisone at home daily, continue same.    All the records are reviewed and case discussed with ED provider. Management plans discussed with the patient, family and they are in agreement.  CODE STATUS: Full code. Code Status History    This patient does not have a recorded code status. Please follow your organizational policy for patients in this situation.      TOTAL TIME TAKING CARE OF THIS PATIENT: 50 minutes.    Vaughan Basta M.D on 09/01/2016   Between 7am to 6pm - Pager - [phone]  After 6pm go to [email or web address] - password [email or web address] [facility]  Office  [phone]  CC: Primary care physician; [facility]   Note: This dictation was prepared with Dragon dictation along with smaller phrase technology.  Any transcriptional errors that result  from this process are unintentional.

## 2016-09-01 NOTE — ED Notes (Signed)
This nurse notified EDP about lack of 20 gauge IV access, EDP called Brandy, RN to see about getting IV access for CT.

## 2016-09-01 NOTE — ED Notes (Signed)
Unsuccessful [place] IV start x2, (LAC)  First one blew and second one was positional and unable to be used for CT Angio

## 2016-09-01 NOTE — ED Provider Notes (Signed)
[facility] Emergency Department Provider Note   First MD Initiated Contact with Patient 09/01/16 0507     (approximate)  I have reviewed the triage vital signs and the nursing notes.   HISTORY  Chief Complaint Shortness of Breath    HPI Angela Li is a 36 y.o. female history of Lupus presents to the emergency department with dyspnea and dizziness 1 day. Patient states that she was seen by primary care provider on Tuesday for the same and diagnosed with pneumonia and prescribed azithromycin. Patient states that no x-ray was performed at that time. Patient also admits to generalized weakness and decreased by mouth intake and diarrhea since taking the antibiotic.   Past Medical History:  Diagnosis Date  . Lupus     There are no active problems to display for this patient.   Past surgical history None  Prior to Admission medications   Medication Sig Start Date End Date Taking? Authorizing Provider  Cholecalciferol (VITAMIN D3) 1000 UNITS CAPS Take 1,000 Int'l Units by mouth daily.    Historical Provider, MD  irbesartan (AVAPRO) 150 MG tablet Take 75 mg by mouth at bedtime. Start 1 week after kidney biopsy 01/21/15 01/21/16  Historical Provider, MD  naproxen sodium (ANAPROX) 220 MG tablet Take 440 mg by mouth every 6 (six) hours as needed (for pain.).    Historical Provider, MD  pantoprazole (PROTONIX) 40 MG tablet Take 40 mg by mouth daily as needed.    Historical Provider, MD  predniSONE (DELTASONE) 5 MG tablet Take 5 mg by mouth daily. 10/05/14   Historical Provider, MD    Allergies Patient has no known allergies.  History reviewed. No pertinent family history.  Social History Social History  Substance Use Topics  . Smoking status: Never Smoker  . Smokeless tobacco: Never Used  . Alcohol use No    Review of Systems Constitutional: No fever/chills Eyes: No visual changes. ENT: No sore throat. Cardiovascular: Denies chest  pain. Respiratory:Positive for dyspnea Gastrointestinal: No abdominal pain.  No nausea, no vomiting.  No diarrhea.  No constipation. Genitourinary: Negative for dysuria. Musculoskeletal: Negative for back pain. Skin: Negative for rash. Neurological: Negative for headaches, focal weakness or numbness.  10-point ROS otherwise negative.  ____________________________________________   PHYSICAL EXAM:  VITAL SIGNS: ED Triage Vitals  Enc Vitals Group     BP 09/01/16 0342 (!) 129/99     Pulse Rate 09/01/16 0342 (!) 101     Resp 09/01/16 0342 15     Temp 09/01/16 0342 98 F (36.7 C)     Temp Source 09/01/16 0342 Axillary     SpO2 09/01/16 0342 98 %     Weight 09/01/16 0342 145 lb (65.8 kg)     Height 09/01/16 0342 _0  (1.651 m)     Head Circumference --      Peak Flow --      Pain Score 09/01/16 0452 10     Pain Loc --      Pain Edu? --      Excl. in [place]? --     Constitutional: Alert and oriented. Well appearing and in no acute distress. Eyes: Conjunctivae are normal. PERRL. EOMI. Head: Atraumatic. Mouth/Throat: Mucous membranes are moist.  Oropharynx non-erythematous. Neck: No stridor.  No meningeal signs.  Cardiovascular: Normal rate, regular rhythm. Good peripheral circulation. Grossly normal heart sounds. Respiratory: Normal respiratory effort.  No retractions. Lungs CTAB. Gastrointestinal: Soft and nontender. No distention.  Musculoskeletal: No lower extremity tenderness nor edema.  No gross deformities of extremities. Neurologic:  Normal speech and language. No gross focal neurologic deficits are appreciated.  Skin:  Skin is warm, dry and intact. No rash noted.  ____________________________________________   LABS (all labs ordered are listed, but only abnormal results are displayed)  Labs Reviewed  CBC - Abnormal; Notable for the following:       Result Value   RBC 6.26 (*)    HCT 47.5 (*)    MCV 75.9 (*)    MCH 24.5 (*)    RDW 25.2 (*)    Platelets 100 (*)     All other components within normal limits  BASIC METABOLIC PANEL - Abnormal; Notable for the following:    Chloride 113 (*)    CO2 17 (*)    BUN 26 (*)    Creatinine, Ser 1.18 (*)    Calcium 7.6 (*)    GFR calc non Af Amer 59 (*)    All other components within normal limits  TROPONIN I - Abnormal; Notable for the following:    Troponin I 0.07 (*)    All other components within normal limits  INFLUENZA PANEL BY PCR (TYPE A & B, H1N1)   ____________________________________________  EKG  ED ECG REPORT I, [place] N Amillia Biffle, the attending physician, personally viewed and interpreted this ECG.   Date: 09/01/2016  EKG Time: 3:50 AM  Rate: 96  Rhythm: Normal sinus rhythm  Axis: Normal  Intervals: Normal  ST&T Change: None  ____________________________________________  RADIOLOGY I, [place] N Jowanna Loeffler, personally viewed and evaluated these images (plain radiographs) as part of my medical decision making, as well as reviewing the written report by the radiologist.  Dg Chest 2 View  Result Date: 09/01/2016 CLINICAL DATA:  Shortness of breath and dizziness. History of lupus. EXAM: CHEST  2 VIEW COMPARISON:  Chest radiograph 03/13/2008 FINDINGS: There is increased, abnormal prominence of the left cardiac silhouette. No focal airspace consolidation or pulmonary edema. No pleural effusion or pneumothorax. IMPRESSION: 1. Increased prominence of the left cardiac silhouette may indicate underlying pulmonary hypertension. This has developed since the prior radiograph of 03/13/2008. Chest CT or echocardiography may be helpful for further assessment. 2. No radiographic evidence of pneumonia. Electronically Signed   By: Ulyses Jarred M.D.   On: 09/01/2016 04:10    _  Procedures   Critical Care performed:   INITIAL IMPRESSION / ASSESSMENT AND PLAN / ED COURSE  Pertinent labs & imaging results that were available during my care of the patient were reviewed by me and considered in my medical  decision making (see chart for details).  36 year old female presenting to the emergency department with dyspnea with previous diagnosis of pneumonia via primary care provider 2 days ago. Patient's chest x-ray today did not reveal any pneumonia however patient with ongoing dyspnea. Laboratory data can concerning for elevated troponin as well as thrombocytopenia as the patient's platelet count is 100. We will obtain CT scan of the chest. Patient's care transferred to Dr. Jimmye Norman   Clinical Course     ____________________________________________  FINAL CLINICAL IMPRESSION(S) / ED DIAGNOSES  Dyspnea  MEDICATIONS GIVEN DURING THIS VISIT:  Medications  albuterol (PROVENTIL) (2.5 MG/3ML) 0.083% nebulizer solution 5 mg (5 mg Nebulization Given 09/01/16 0419)  aspirin chewable tablet 324 mg (324 mg Oral Given 09/01/16 0557)     NEW OUTPATIENT MEDICATIONS STARTED DURING THIS VISIT:  New Prescriptions   No medications on file    Modified Medications   No medications on file  Discontinued Medications   No medications on file     Note:  This document was prepared using Dragon voice recognition software and may include unintentional dictation errors.    Gregor Hams, MD 09/01/16 [phone]

## 2016-09-01 NOTE — Plan of Care (Signed)
Problem: Tissue Perfusion: Goal: Risk factors for ineffective tissue perfusion will decrease Outcome: Progressing SCD's in place

## 2016-09-01 NOTE — ED Triage Notes (Signed)
Pt arrived by EMS from home with c/o SOB and dizziness x1 day. Pt seen PCP on Tuesday, treated for pneumonia, given Azythromycin. Pt reports she has had decreased oral intake, feels weak and has had diarrhea since on antibiotic. In triage pt is pale in color.

## 2016-09-01 NOTE — Consult Note (Signed)
[facility] Cardiology Consultation Note  Patient ID: Angela Li, MRN: [id number], DOB/AGE: 1980/12/14 36 y.o. Admit date: 09/01/2016   Date of Consult: 09/01/2016 Primary Physician: [facility] Primary Cardiologist: None  Chief Complaint:  Chief Complaint  Patient presents with  . Shortness of Breath   Reason for Consult: shortness of breath chest pain  HPI: 36 y.o. female with collagen vascular disease most consistent with lupus erythematosus and essential hypertension. The patient has had significant progression of shortness of breath and left chest pressure over the last several weeks culminating in seeing the emergency room physicians. At that time the patient has had a chest x-ray showing a left pleural effusion as well as a small paracardial effusion and some mild amount of hypoxia. Additionally the patient had a CAT scan also suggesting and confirming the above pleural and pericardial effusion. The patient has had enlarged pulmonary arteries suggesting pulmonary hypertension. Echocardiogram has been performed showing normal LV systolic function with severe pulmonary hypertension with RV pressure of approximately 100 mm. Additionally there is or cardial effusion to a mild degree without temporal not. Ejection fraction appears to be 55%. There are no other cardiovascular issues other than some ascites more consistent with continued lupus and pulmonary hypertension abnormalities  Past Medical History:  Diagnosis Date  . Collagen vascular disease ([place])   . Hypertension   . Lupus       Surgical History: History reviewed. No pertinent surgical history.   Home Meds: Prior to Admission medications   Medication Sig Start Date End Date Taking? Authorizing Provider  acetaminophen (TYLENOL) 325 MG tablet Take 650 mg by mouth every 4 (four) hours as needed.   Yes Historical Provider, MD  pantoprazole (PROTONIX) 40 MG tablet Take 40 mg by mouth 2 (two) times daily.     Yes Historical Provider, MD  predniSONE (DELTASONE) 5 MG tablet Take 5 mg by mouth 2 (two) times daily with a meal.  10/05/14  Yes Historical Provider, MD  Cholecalciferol (VITAMIN D3) 1000 UNITS CAPS Take 1,000 Int'l Units by mouth daily.    Historical Provider, MD    Inpatient Medications:  . cholecalciferol  1,000 Units Oral Daily  . indomethacin  75 mg Oral BID WC  . pantoprazole  40 mg Oral BID  . predniSONE  5 mg Oral BID WC  . sodium chloride flush  3 mL Intravenous Q12H     Allergies: No Known Allergies  Social History   Social History  . Marital status: Single    Spouse name: N/A  . Number of children: N/A  . Years of education: N/A   Occupational History  . Not on file.   Social History Main Topics  . Smoking status: Never Smoker  . Smokeless tobacco: Never Used  . Alcohol use No  . Drug use: No  . Sexual activity: Not on file   Other Topics Concern  . Not on file   Social History Narrative  . No narrative on file     Family History  Problem Relation Age of Onset  . Diabetes Father      Review of Systems Positive for Shortness of breath Negative for: General:  chills, fever, night sweats or weight changes.  Cardiovascular: PND orthopnea syncope dizziness  Dermatological skin lesions rashes Respiratory: Cough congestion Urologic: Frequent urination urination at night and hematuria Abdominal: negative for nausea, vomiting, diarrhea, bright red blood per rectum, melena, or hematemesis Neurologic: negative for visual changes, and/or hearing changes  All other  systems reviewed and are otherwise negative except as noted above.  Labs:  Recent Labs  09/01/16 0352 09/01/16 0751 09/01/16 1408  TROPONINI 0.07* 0.14* 0.13*   Lab Results  Component Value Date   WBC 8.4 09/01/2016   HGB 15.3 09/01/2016   HCT 47.5 (H) 09/01/2016   MCV 75.9 (L) 09/01/2016   PLT 100 (L) 09/01/2016    Recent Labs Lab 09/01/16 0352  NA 136  K 5.0  CL 113*  CO2  17*  BUN 26*  CREATININE 1.18*  CALCIUM 7.6*  GLUCOSE 97   No results found for: CHOL, HDL, LDLCALC, TRIG Lab Results  Component Value Date   DDIMER  03/13/2008    0.30        AT THE INHOUSE ESTABLISHED CUTOFF VALUE OF 0.48 ug/mL FEU, THIS ASSAY HAS BEEN DOCUMENTED IN THE LITERATURE TO HAVE    Radiology/Studies:  Dg Chest 2 View  Result Date: 09/01/2016 CLINICAL DATA:  Shortness of breath and dizziness. History of lupus. EXAM: CHEST  2 VIEW COMPARISON:  Chest radiograph 03/13/2008 FINDINGS: There is increased, abnormal prominence of the left cardiac silhouette. No focal airspace consolidation or pulmonary edema. No pleural effusion or pneumothorax. IMPRESSION: 1. Increased prominence of the left cardiac silhouette may indicate underlying pulmonary hypertension. This has developed since the prior radiograph of 03/13/2008. Chest CT or echocardiography may be helpful for further assessment. 2. No radiographic evidence of pneumonia. Electronically Signed   By: Ulyses Jarred M.D.   On: 09/01/2016 04:10   Ct Angio Chest Pe W And/or Wo Contrast  Result Date: 09/01/2016 CLINICAL DATA:  36 year old female with malaise for 1 week. Increasing left side chest pain for 4 days with shortness of breath. Initial encounter. EXAM: CT ANGIOGRAPHY CHEST WITH CONTRAST TECHNIQUE: Multidetector CT imaging of the chest was performed using the standard protocol during bolus administration of intravenous contrast. Multiplanar CT image reconstructions and MIPs were obtained to evaluate the vascular anatomy. CONTRAST:  75 mL Isovue 370 COMPARISON:  Chest radiographs 0358 hours today, and earlier. Prior chest CTA 02/01/2004. FINDINGS: Cardiovascular: Good contrast bolus timing in the pulmonary arterial tree. There is enlargement of the central pulmonary artery, with a diameter estimated at 40 mm (versus about 30 mm diameter of the adjacent ascending aorta. Respiratory motion artifact at both lung lung bases obscures  detail of the subsegmental lower lobe pulmonary arteries. There is otherwise no pulmonary artery filling defect to suggest pulmonary embolus. A small pericardial effusion is new, measuring up to 11 mm in thickness along the anterior surface of the heart (series 4, image 70). There is mild new cardiomegaly compared to 2005. Grossly negative aorta, less arterial contrast today compared to the 2005 study. Mediastinum/Nodes: No mediastinal lymphadenopathy. Axillary lymph nodes appear stable since 2005. Lungs/Pleura: Small layering left pleural effusion. Major airways are patent. There is mild gas trapping in the superior segment of the left lower lobe. There is no abnormal pulmonary opacity aside from mild left lower lobe atelectasis. Upper Abdomen: Trace or small volume ascites in the upper abdomen, most apparent along the liver contour (series 4, image 82). Otherwise negative visualized liver (small calcified granuloma in the right lobe series 4, image 88), gallbladder, spleen, adrenal glands, pancreas, and renal upper poles. Negative visible bowel in the upper abdomen. Musculoskeletal: No osseous abnormality identified. Review of the MIP images confirms the above findings. IMPRESSION: 1. Central pulmonary artery enlargement has developed since 2005 suggesting pulmonary artery hypertension, but there is no pulmonary artery filling defect to  indicate pulmonary embolus - note that subsegmental lower lobe branches are obscured by respiratory motion. 2. Additionally there is a constellation of new small pericardial effusion, a small layering left pleural effusion, and a small volume of ascites in the upper abdomen. These findings are nonspecific and could be seen with anasarca, but consider also an acute systemic inflammatory process (which would include pericarditis). There is also mild cardiomegaly compared to 2005. 3. Negative lungs except for mild left lower lobe areas of gas trapping and atelectasis. Electronically  Signed   By: Genevie Ann M.D.   On: 09/01/2016 08:26    EKG: Normal sinus rhythm  Weights: Filed Weights   09/01/16 0342 09/01/16 1234  Weight: 65.8 kg (145 lb) 67.9 kg (149 lb 11.2 oz)     Physical Exam: Blood pressure (!) 132/98, pulse 99, temperature 98.4 F (36.9 C), resp. rate 18, height 5' 5" (1.651 m), weight 67.9 kg (149 lb 11.2 oz), last menstrual period 08/27/2016, SpO2 97 %. Body mass index is 24.91 kg/m. General: Well developed, well nourished, in no acute distress. Head eyes ears nose throat: Normocephalic, atraumatic, sclera non-icteric, no xanthomas, nares are without discharge. No apparent thyromegaly and/or mass  Lungs: Normal respiratory effort.  no wheezes, no rales, no rhonchi.  Heart: RRR with normal S1 S2. no murmur gallop, no rub, PMI is normal size and placement, carotid upstroke normal without bruit, jugular venous pressure is normal Abdomen: Soft, non-tender, non-distended with normoactive bowel sounds. No hepatomegaly. No rebound/guarding. No obvious abdominal masses. Abdominal aorta is normal size without bruit Extremities: No edema. no cyanosis, no clubbing, no ulcers  Peripheral : 2+ bilateral upper extremity pulses, 2+ bilateral femoral pulses, 2+ bilateral dorsal pedal pulse Neuro: Alert and oriented. No facial asymmetry. No focal deficit. Moves all extremities spontaneously. Musculoskeletal: Normal muscle tone without kyphosis Psych:  Responds to questions appropriately with a normal affect.    Assessment: 36 year old female with a collagen vascular disease and systemic lupus erythematosus having worsening shortness of breath pleural effusion pericardial effusion pulmonary hypertension and mild ascites consistent with severe pulmonary hypertension likely due to collagen vascular disease  Plan: 1. No further cardiac intervention at this time due to no primary cardiac involvement of collagen vascular disease 2. No further intervention of small pericardial  effusion without evidence of temporal not 3. Pulmonary consultation for further evaluation and treatment of severe pulmonary hypertension 4. Further diagnostic testing and treatment options as per pulmonary as above which showed may involve the right heart catheterization to assess significance of pulmonary hypertension  Signed, Corey Skains M.D. [facility] 09/01/2016, 6:58 PM

## 2016-09-01 NOTE — Progress Notes (Signed)
*  PRELIMINARY RESULTS* Echocardiogram 2D Echocardiogram has been performed.  Angela Li 09/01/2016, 2:59 PM

## 2016-09-01 NOTE — Progress Notes (Signed)
Pt complaining of 7/10 headache.  Declines po Tylenol due to nausea.  Reports morphine IV "made me throw up".  Skin cold to touch.  Previous temp noted.  Dr Jannifer Franklin made aware.  New orders to be entered.

## 2016-09-01 NOTE — Plan of Care (Signed)
Problem: Pain Managment: Goal: General experience of comfort will improve Outcome: Progressing Pain assessed

## 2016-09-01 NOTE — ED Notes (Signed)
This nurse and Colletta Maryland, RN looked for an IV for 20 gauge. Unable to place IV at this time due to not finding a large enough vein. Will speak with an RN that has [place] training for IV.

## 2016-09-01 NOTE — ED Provider Notes (Addendum)
IMPRESSION: 1. Central pulmonary artery enlargement has developed since 2005 suggesting pulmonary artery hypertension, but there is no pulmonary artery filling defect to indicate pulmonary embolus - note that subsegmental lower lobe branches are obscured by respiratory motion. 2. Additionally there is a constellation of new small pericardial effusion, a small layering left pleural effusion, and a small volume of ascites in the upper abdomen. These findings are nonspecific and could be seen with anasarca, but consider also an acute systemic inflammatory process (which would include pericarditis). There is also mild cardiomegaly compared to 2005. 3. Negative lungs except for mild left lower lobe areas of gas trapping and atelectasis.   CT scan findings as dictated above, likely sequela of lupus flareup. We will discuss with the cardiologist and hospitalist for admission.   Earleen Newport, MD 09/01/16 [place], MD 09/01/16 [phone]

## 2016-09-01 NOTE — ED Notes (Signed)
Notified EDP of troponin 0.07

## 2016-09-02 ENCOUNTER — Encounter: Payer: Self-pay | Admitting: *Deleted

## 2016-09-02 DIAGNOSIS — E872 Acidosis: Secondary | ICD-10-CM | POA: Diagnosis present

## 2016-09-02 DIAGNOSIS — M3212 Pericarditis in systemic lupus erythematosus: Secondary | ICD-10-CM | POA: Diagnosis present

## 2016-09-02 DIAGNOSIS — R188 Other ascites: Secondary | ICD-10-CM | POA: Diagnosis present

## 2016-09-02 DIAGNOSIS — N19 Unspecified kidney failure: Secondary | ICD-10-CM | POA: Diagnosis present

## 2016-09-02 DIAGNOSIS — I10 Essential (primary) hypertension: Secondary | ICD-10-CM | POA: Diagnosis present

## 2016-09-02 DIAGNOSIS — M3213 Lung involvement in systemic lupus erythematosus: Secondary | ICD-10-CM | POA: Diagnosis not present

## 2016-09-02 DIAGNOSIS — E875 Hyperkalemia: Secondary | ICD-10-CM | POA: Diagnosis present

## 2016-09-02 DIAGNOSIS — M3214 Glomerular disease in systemic lupus erythematosus: Secondary | ICD-10-CM | POA: Diagnosis present

## 2016-09-02 DIAGNOSIS — J90 Pleural effusion, not elsewhere classified: Secondary | ICD-10-CM | POA: Diagnosis present

## 2016-09-02 DIAGNOSIS — N179 Acute kidney failure, unspecified: Secondary | ICD-10-CM

## 2016-09-02 DIAGNOSIS — I309 Acute pericarditis, unspecified: Secondary | ICD-10-CM | POA: Diagnosis present

## 2016-09-02 DIAGNOSIS — I272 Pulmonary hypertension, unspecified: Secondary | ICD-10-CM | POA: Diagnosis present

## 2016-09-02 DIAGNOSIS — Z79899 Other long term (current) drug therapy: Secondary | ICD-10-CM | POA: Diagnosis not present

## 2016-09-02 DIAGNOSIS — R0602 Shortness of breath: Secondary | ICD-10-CM | POA: Diagnosis not present

## 2016-09-02 LAB — CBC
HCT: 50 % — ABNORMAL HIGH (ref 35.0–47.0)
Hemoglobin: 15.7 g/dL (ref 12.0–16.0)
MCH: 23.8 pg — AB (ref 26.0–34.0)
MCHC: 31.5 g/dL — AB (ref 32.0–36.0)
MCV: 75.7 fL — ABNORMAL LOW (ref 80.0–100.0)
PLATELETS: 87 10*3/uL — AB (ref 150–440)
RBC: 6.6 MIL/uL — ABNORMAL HIGH (ref 3.80–5.20)
RDW: 24.8 % — AB (ref 11.5–14.5)
WBC: 7 10*3/uL (ref 3.6–11.0)

## 2016-09-02 LAB — BASIC METABOLIC PANEL
Anion gap: 10 (ref 5–15)
Anion gap: 8 (ref 5–15)
BUN: 42 mg/dL — AB (ref 6–20)
BUN: 46 mg/dL — AB (ref 6–20)
CALCIUM: 7.3 mg/dL — AB (ref 8.9–10.3)
CHLORIDE: 113 mmol/L — AB (ref 101–111)
CO2: 8 mmol/L — AB (ref 22–32)
CO2: 9 mmol/L — ABNORMAL LOW (ref 22–32)
CREATININE: 1.87 mg/dL — AB (ref 0.44–1.00)
CREATININE: 2.04 mg/dL — AB (ref 0.44–1.00)
Calcium: 7.3 mg/dL — ABNORMAL LOW (ref 8.9–10.3)
Chloride: 113 mmol/L — ABNORMAL HIGH (ref 101–111)
GFR calc Af Amer: 39 mL/min — ABNORMAL LOW (ref >60)
GFR calc Af Amer: 35 mL/min — ABNORMAL LOW (ref >60)
GFR calc non Af Amer: 34 mL/min — ABNORMAL LOW (ref >60)
GFR, EST NON AFRICAN AMERICAN: 30 mL/min — AB (ref >60)
GLUCOSE: 137 mg/dL — AB (ref 65–99)
Glucose, Bld: 146 mg/dL — ABNORMAL HIGH (ref 65–99)
Potassium: 6.4 mmol/L (ref 3.5–5.1)
Potassium: 6.4 mmol/L (ref 3.5–5.1)
SODIUM: 130 mmol/L — AB (ref 135–145)
Sodium: 131 mmol/L — ABNORMAL LOW (ref 135–145)

## 2016-09-02 LAB — MRSA PCR SCREENING: MRSA by PCR: NEGATIVE

## 2016-09-02 LAB — GLUCOSE, CAPILLARY: Glucose-Capillary: 144 mg/dL — ABNORMAL HIGH (ref 65–99)

## 2016-09-02 MED ORDER — SODIUM BICARBONATE 8.4 % IV SOLN: 125.0000 mL/h | INTRAVENOUS | 0 refills | Status: AC

## 2016-09-02 MED ORDER — METHYLPREDNISOLONE SODIUM SUCC 125 MG IJ SOLR
125.0000 mg | Freq: Once | INTRAMUSCULAR | Status: AC
  Administered 2016-09-02: 125 mg via INTRAVENOUS

## 2016-09-02 MED ORDER — METHYLPREDNISOLONE SODIUM SUCC 125 MG IJ SOLR
INTRAMUSCULAR | Status: AC
  Filled 2016-09-02: qty 2

## 2016-09-02 MED ORDER — SODIUM CHLORIDE 0.9 % IV SOLN
INTRAVENOUS | Status: DC
  Administered 2016-09-02: 08:00:00 via INTRAVENOUS

## 2016-09-02 MED ORDER — PATIROMER SORBITEX CALCIUM 8.4 G PO PACK
16.8000 g | PACK | Freq: Every day | ORAL | Status: DC
  Filled 2016-09-02: qty 4

## 2016-09-02 MED ORDER — SILDENAFIL CITRATE 20 MG PO TABS
20.0000 mg | ORAL_TABLET | Freq: Three times a day (TID) | ORAL | Status: DC
  Administered 2016-09-02: 20 mg via ORAL
  Filled 2016-09-02 (×3): qty 1

## 2016-09-02 MED ORDER — DEXTROSE 50 % IV SOLN
25.0000 mL | Freq: Once | INTRAVENOUS | Status: AC
  Administered 2016-09-02: 25 mL via INTRAVENOUS
  Filled 2016-09-02: qty 50

## 2016-09-02 MED ORDER — INSULIN REGULAR HUMAN 100 UNIT/ML IJ SOLN
5.0000 [IU] | Freq: Once | INTRAMUSCULAR | Status: AC
  Administered 2016-09-02: 5 [IU] via INTRAVENOUS
  Filled 2016-09-02: qty 0.05

## 2016-09-02 MED ORDER — NICARDIPINE HCL IN NACL 20-0.86 MG/200ML-% IV SOLN
3.0000 mg/h | INTRAVENOUS | Status: DC
  Administered 2016-09-02: 5 mg/h via INTRAVENOUS
  Filled 2016-09-02: qty 200

## 2016-09-02 MED ORDER — SODIUM CHLORIDE 0.9 % IV SOLN
1.0000 g | Freq: Once | INTRAVENOUS | Status: AC
  Administered 2016-09-02: 1 g via INTRAVENOUS
  Filled 2016-09-02: qty 10

## 2016-09-02 MED ORDER — ALBUTEROL SULFATE (2.5 MG/3ML) 0.083% IN NEBU
2.5000 mg | INHALATION_SOLUTION | RESPIRATORY_TRACT | Status: AC
  Administered 2016-09-02 (×3): 2.5 mg via RESPIRATORY_TRACT
  Filled 2016-09-02: qty 9

## 2016-09-02 MED ORDER — SODIUM BICARBONATE 8.4 % IV SOLN
INTRAVENOUS | Status: DC
  Administered 2016-09-02: 11:00:00 via INTRAVENOUS
  Filled 2016-09-02 (×3): qty 150

## 2016-09-02 MED ORDER — ONDANSETRON HCL 4 MG/2ML IJ SOLN: 4.0000 mg | Freq: Four times a day (QID) | INTRAMUSCULAR | 0 refills | Status: AC | PRN

## 2016-09-02 MED ORDER — ALUM & MAG HYDROXIDE-SIMETH 200-200-20 MG/5ML PO SUSP
30.0000 mL | Freq: Four times a day (QID) | ORAL | Status: DC | PRN
  Administered 2016-09-02: 30 mL via ORAL
  Filled 2016-09-02: qty 30

## 2016-09-02 MED ORDER — SODIUM POLYSTYRENE SULFONATE 15 GM/60ML PO SUSP
30.0000 g | Freq: Once | ORAL | Status: AC
  Administered 2016-09-02: 30 g via ORAL
  Filled 2016-09-02: qty 120

## 2016-09-02 NOTE — Progress Notes (Signed)
[facility] NAME: Angela Li    MR#:  [id number]  DATE OF BIRTH:  July 12, 1981  SUBJECTIVE:  CHIEF COMPLAINT:   Chief Complaint  Patient presents with  . Shortness of Breath     Came with progressive SOB for 1 month.   Overnight, became nauseated, some hypotensive, Have acute renal failure and metabolic acidosis.    Still alert and oriented. K is high.   Nurse called in morning and seen early with Dr. Holley Raring.  REVIEW OF SYSTEMS:  CONSTITUTIONAL: No fever, fatigue or weakness.  EYES: No blurred or double vision.  EARS, NOSE, AND THROAT: No tinnitus or ear pain.  RESPIRATORY: No cough, shortness of breath, wheezing or hemoptysis.  CARDIOVASCULAR: No chest pain, orthopnea, edema.  GASTROINTESTINAL: have nausea, vomiting,no diarrhea, have epigastric abdominal pain.  GENITOURINARY: No dysuria, hematuria.  ENDOCRINE: No polyuria, nocturia,  HEMATOLOGY: No anemia, easy bruising or bleeding SKIN: No rash or lesion. MUSCULOSKELETAL: No joint pain or arthritis.   NEUROLOGIC: No tingling, numbness, weakness.  PSYCHIATRY: No anxiety or depression.   ROS  DRUG ALLERGIES:  No Known Allergies  VITALS:  Blood pressure (!) 117/94, pulse (!) 103, temperature (!) 96.8 F (36 C), temperature source Axillary, resp. rate 18, height _0  (1.651 m), weight 66.3 kg (146 lb 2.6 oz), last menstrual period 08/27/2016, SpO2 99 %.  PHYSICAL EXAMINATION:  GENERAL:  36 y.o.-year-old patient lying in the bed with acute distress.  EYES: Pupils equal, round, reactive to light and accommodation. No scleral icterus. Extraocular muscles intact.  HEENT: Head atraumatic, normocephalic. Oropharynx and nasopharynx clear.  NECK:  Supple, no jugular venous distention. No thyroid enlargement, no tenderness.  LUNGS: Normal breath sounds bilaterally, no wheezing, rales,rhonchi or crepitation. positive use of accessory muscles of respiration. Fast  breathing. CARDIOVASCULAR: S1, S2 normal- fast, hyperdynamic sounds. No murmurs, rubs, or gallops.  ABDOMEN: Soft, nontender, nondistended. Bowel sounds present. No organomegaly or mass.  EXTREMITIES: some pedal edema, b/l lower extremities are very cold and some  Mottling present, very weak pulses. NEUROLOGIC: Cranial nerves II through XII are intact. Muscle strength 5/5 in all extremities. Sensation intact. Gait not checked.  PSYCHIATRIC: The patient is alert and oriented x 3.  SKIN: No obvious rash, lesion, or ulcer.   Physical Exam LABORATORY PANEL:   CBC  Recent Labs Lab 09/02/16 0540  WBC 7.0  HGB 15.7  HCT 50.0*  PLT 87*   ------------------------------------------------------------------------------------------------------------------  Chemistries   Recent Labs Lab 09/02/16 0722  NA 130*  K 6.4*  CL 113*  CO2 9*  GLUCOSE 146*  BUN 46*  CREATININE 2.04*  CALCIUM 7.3*   ------------------------------------------------------------------------------------------------------------------  Cardiac Enzymes  Recent Labs Lab 09/01/16 1408 09/01/16 1818  TROPONINI 0.13* 0.15*   ------------------------------------------------------------------------------------------------------------------  RADIOLOGY:  Dg Chest 2 View  Result Date: 09/01/2016 CLINICAL DATA:  Shortness of breath and dizziness. History of lupus. EXAM: CHEST  2 VIEW COMPARISON:  Chest radiograph 03/13/2008 FINDINGS: There is increased, abnormal prominence of the left cardiac silhouette. No focal airspace consolidation or pulmonary edema. No pleural effusion or pneumothorax. IMPRESSION: 1. Increased prominence of the left cardiac silhouette may indicate underlying pulmonary hypertension. This has developed since the prior radiograph of 03/13/2008. Chest CT or echocardiography may be helpful for further assessment. 2. No radiographic evidence of pneumonia. Electronically Signed   By: Ulyses Jarred M.D.    On: 09/01/2016 04:10   Ct Angio Chest Pe W And/or Wo Contrast  Result Date:  09/01/2016 CLINICAL DATA:  36 year old female with malaise for 1 week. Increasing left side chest pain for 4 days with shortness of breath. Initial encounter. EXAM: CT ANGIOGRAPHY CHEST WITH CONTRAST TECHNIQUE: Multidetector CT imaging of the chest was performed using the standard protocol during bolus administration of intravenous contrast. Multiplanar CT image reconstructions and MIPs were obtained to evaluate the vascular anatomy. CONTRAST:  75 mL Isovue 370 COMPARISON:  Chest radiographs 0358 hours today, and earlier. Prior chest CTA 02/01/2004. FINDINGS: Cardiovascular: Good contrast bolus timing in the pulmonary arterial tree. There is enlargement of the central pulmonary artery, with a diameter estimated at 40 mm (versus about 30 mm diameter of the adjacent ascending aorta. Respiratory motion artifact at both lung lung bases obscures detail of the subsegmental lower lobe pulmonary arteries. There is otherwise no pulmonary artery filling defect to suggest pulmonary embolus. A small pericardial effusion is new, measuring up to 11 mm in thickness along the anterior surface of the heart (series 4, image 70). There is mild new cardiomegaly compared to 2005. Grossly negative aorta, less arterial contrast today compared to the 2005 study. Mediastinum/Nodes: No mediastinal lymphadenopathy. Axillary lymph nodes appear stable since 2005. Lungs/Pleura: Small layering left pleural effusion. Major airways are patent. There is mild gas trapping in the superior segment of the left lower lobe. There is no abnormal pulmonary opacity aside from mild left lower lobe atelectasis. Upper Abdomen: Trace or small volume ascites in the upper abdomen, most apparent along the liver contour (series 4, image 82). Otherwise negative visualized liver (small calcified granuloma in the right lobe series 4, image 88), gallbladder, spleen, adrenal glands, pancreas,  and renal upper poles. Negative visible bowel in the upper abdomen. Musculoskeletal: No osseous abnormality identified. Review of the MIP images confirms the above findings. IMPRESSION: 1. Central pulmonary artery enlargement has developed since 2005 suggesting pulmonary artery hypertension, but there is no pulmonary artery filling defect to indicate pulmonary embolus - note that subsegmental lower lobe branches are obscured by respiratory motion. 2. Additionally there is a constellation of new small pericardial effusion, a small layering left pleural effusion, and a small volume of ascites in the upper abdomen. These findings are nonspecific and could be seen with anasarca, but consider also an acute systemic inflammatory process (which would include pericarditis). There is also mild cardiomegaly compared to 2005. 3. Negative lungs except for mild left lower lobe areas of gas trapping and atelectasis. Electronically Signed   By: Genevie Ann M.D.   On: 09/01/2016 08:26    ASSESSMENT AND PLAN:   Principal Problem:   Pericarditis  * Acute renal failure   Metabolic acidosis   Hyperkalemia   Likely secondary to acute lupus flare    IV fluids with Bicarb   Kayexalate, Calcium inj IV, Dextrose+ Insulin and albuterol nebs given.   Recheck renal func and K in 4-5 hrs.   IV steroid one dose given.   She received One dose indomethacine yesterday 75 mg. Stopped now.   Appreciated nephro help.   Working on transfer to [facility]  * severe decompensated pulmonary hypertension   Underlying Lupus.   As per Echo- right heart pressure 100 mm Hg.      Appreciated Intensivist help   Transfer to step down unit for now.   May give sildenafil and will need help from specialized centers for handling pulm hypertension.   Trying to transfer to [facility]  * Appreciated help by cardiologist. * Pt gave me her fiance's number to call. I  called pt's fianceRonalee Belts- [phone], spoke to him on phone, updated about her critical and  worsening condition and possibility of transferring to [facility]. I also told him to update other family members.   All the records are reviewed and case discussed with Care Management/Social Workerr. Management plans discussed with the patient, family and they are in agreement.  CODE STATUS: Full  TOTAL TIME TAKING CARE OF THIS PATIENT: 50 critical care minutes.  Discussed with pt. Her fiance, nephrologist and intencivist.   POSSIBLE D/C IN 1-2 DAYS, DEPENDING ON CLINICAL CONDITION.   Vaughan Basta M.D on 09/02/2016   Between 7am to 6pm - Pager - [phone]  After 6pm go to [email or web address] - password [email or web address] [facility]  Office  [phone]  CC: Primary care physician; [facility]  Note: This dictation was prepared with Dragon dictation along with smaller phrase technology. Any transcriptional errors that result from this process are unintentional.

## 2016-09-02 NOTE — Progress Notes (Signed)
Spoke to [facility] transfer center, gave pt' s information and requested transfer to ICU.  Waiting for call back.

## 2016-09-02 NOTE — Progress Notes (Signed)
Pt discharged via [facility] life flight. Pt in stable condition upon discharge. All belongings with pt family. No complaints of pain.

## 2016-09-02 NOTE — Progress Notes (Signed)
Spoke with Dr.Simmonds regarding pt rising BP and MAP. New orders given for Nicardipine Drip.  New orders acknowledged.  Will continue to assess.

## 2016-09-02 NOTE — Progress Notes (Signed)
Pt arrived to unit on 2L O2. Pt is able to answer orientation questions correctly. Pt has mottling on hands and feet, reporting numbness in 5th toe on both feet. Pt is able to communicate softly in short sentences. BP 131/ 108 (MAP) 114. Pulse 102 O2 sats at 92 % RR 26. Pt currently denying pain. Abdomen is slightly distended but soft.  Family at bedside. Will continue to assess.

## 2016-09-02 NOTE — Progress Notes (Signed)
[facility] transfer center called back, I spoke to Dr. Wynonia Lawman- he approved transfer to [facility] ICU.  Informed Nurse and case manager here. Spoke to pt and her relative about the plan. Finished the discharge paperworks for transfer.  Additional critical care time spent 40 min.

## 2016-09-02 NOTE — Consult Note (Signed)
[facility] CONSULT NOTE    Date: 09/02/2016                  Patient Name:  Angela Li  MRN: [id number]  DOB: 30-Mar-1981  Age / Sex: 36 y.o., female         PCP: [facility]                 Service Requesting Consult: Hospitalist                 Reason for Consult: Acute renal failure.            History of Present Illness: Patient is a 36 y.o. female with a PMHx of Systemic lupus erythematosus, hypertension, was Angel Lupus nephritis, mild alopecia, who was admitted to [facility] on 09/01/2016 for evaluation of shortness of breath and chest pain. Patient reports that over the past month she's been having increasing shortness of breath as well as chest pain. As above she has underlying systemic lupus erythematosus and is followed by rheumatology at [facility]  Upon initial evaluation here patient was potentially felt to have pericarditis. She had a CT scan of the chest which showed central enlargement of the pulmonary artery. In addition she was found to have a left-sided pleural effusion and pericardial effusion as well. Patient was started on indomethacin for the pericardial effusion. Renal function appears to be deteriorating over the past several days. Creatinine currently up to 2.04 with a potassium of 6.4. Patient also has significant metabolic acidosis with a serum bicarbonate of 9. Patient also appears to be having Raynaud's phenomenon particularly in her lower extremities.   Medications: Outpatient medications: Prescriptions Prior to Admission  Medication Sig Dispense Refill Last Dose  . acetaminophen (TYLENOL) 325 MG tablet Take 650 mg by mouth every 4 (four) hours as needed.   prn at prn  . pantoprazole (PROTONIX) 40 MG tablet Take 40 mg by mouth 2 (two) times daily.    08/31/2016 at 2000  . predniSONE (DELTASONE) 5 MG tablet Take 5 mg by mouth 2 (two) times daily with a meal.    08/31/2016 at 2000  . Cholecalciferol (VITAMIN D3) 1000 UNITS CAPS  Take 1,000 Int'l Units by mouth daily.   Not Taking at Unknown time    Current medications: Current Facility-Administered Medications  Medication Dose Route Frequency Provider Last Rate Last Dose  . acetaminophen (TYLENOL) tablet 650 mg  650 mg Oral Q4H PRN Vaughan Basta, MD      . alum & mag hydroxide-simeth (MAALOX/MYLANTA) 200-200-20 MG/5ML suspension 30 mL  30 mL Oral Q6H PRN Vaughan Basta, MD   30 mL at 09/02/16 0806  . cholecalciferol (VITAMIN D) tablet 1,000 Units  1,000 Units Oral Daily Vaughan Basta, MD   1,000 Units at 09/01/16 1703  . ondansetron (ZOFRAN) injection 4 mg  4 mg Intravenous Q6H PRN Vaughan Basta, MD      . pantoprazole (PROTONIX) EC tablet 40 mg  40 mg Oral BID Vaughan Basta, MD   40 mg at 09/01/16 1702  . predniSONE (DELTASONE) tablet 5 mg  5 mg Oral BID WC Vaughan Basta, MD   5 mg at 09/01/16 1703  . prochlorperazine (COMPAZINE) injection 5 mg  5 mg Intravenous Q4H PRN Lance Coon, MD   5 mg at 09/02/16 0615  . sodium bicarbonate 150 mEq in dextrose 5 % 1,000 mL infusion   Intravenous Continuous Vaughan Basta, MD      . sodium chloride  flush (NS) 0.9 % injection 3 mL  3 mL Intravenous Q12H Vaughan Basta, MD   3 mL at 09/02/16 1000      Allergies: No Known Allergies    Past Medical History: Past Medical History:  Diagnosis Date  . Collagen vascular disease ([place])   . Hypertension   . 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 Diabetes Father      Social History: Social History   Social History  . Marital status: Single    Spouse name: N/A  . Number of children: N/A  . Years of education: N/A   Occupational History  . Not on file.   Social History Main Topics  . Smoking status: Never Smoker  . Smokeless tobacco: Never Used  . Alcohol use No  . Drug use: No  .  Sexual activity: Not on file   Other Topics Concern  . Not on file   Social History Narrative  . No narrative on file     Review of Systems: Review of Systems  Constitutional: Positive for malaise/fatigue. Negative for chills, fever and weight loss.  HENT: Negative for ear discharge, ear pain, hearing loss and tinnitus.   Eyes: Negative for blurred vision, double vision and photophobia.  Respiratory: Positive for shortness of breath. Negative for cough and hemoptysis.   Cardiovascular: Positive for chest pain and orthopnea. Negative for leg swelling.  Gastrointestinal: Negative for heartburn, nausea and vomiting.  Genitourinary: Negative for dysuria, frequency and urgency.  Musculoskeletal: Positive for joint pain and myalgias.  Skin: Positive for rash.  Neurological: Positive for tingling. Negative for dizziness, focal weakness and weakness.  Endo/Heme/Allergies: Negative for polydipsia. Does not bruise/bleed easily.  Psychiatric/Behavioral: Negative for depression. The patient is nervous/anxious.      Vital Signs: Blood pressure (!) 131/108, pulse (!) 102, temperature (!) 96.8 F (36 C), temperature source Axillary, resp. rate (!) 23, height _0  (1.651 m), weight 66.3 kg (146 lb 2.6 oz), last menstrual period 08/27/2016, SpO2 98 %.  Weight trends: Filed Weights   09/01/16 0342 09/01/16 1234 09/02/16 1046  Weight: 65.8 kg (145 lb) 67.9 kg (149 lb 11.2 oz) 66.3 kg (146 lb 2.6 oz)    Physical Exam: General: Laying comfortably in bed   Head: Alopecia noted  Eyes: Anicteric, EOMI  Nose: Mucous membranes moist, not inflammed, nonerythematous.  Throat: Oropharynx nonerythematous, no exudate appreciated.   Neck: Supple, trachea midline.  Lungs:  Diminished at left base, otherwise clear  Heart: S1S2 hyperdynamic heart sounds, mild friction rub  Abdomen:  BS normoactive. Soft, Nondistended, non-tender.  No masses or organomegaly.  Extremities: No pretibial edema.  Raynouds  noted in both feet  Neurologic: A&O X3, Motor strength is 5/5 in the all 4 extremities  Skin: Rash on her lower neck and rash noted on upper back    Lab results: Basic Metabolic Panel:  Recent Labs Lab 09/01/16 0352 09/02/16 0540 09/02/16 0722  NA 136 131* 130*  K 5.0 6.4* 6.4*  CL 113* 113* 113*  CO2 17* 8* 9*  GLUCOSE 97 137* 146*  BUN 26* 42* 46*  CREATININE 1.18* 1.87* 2.04*  CALCIUM 7.6* 7.3* 7.3*    Liver Function Tests: No results for input(s): AST, ALT, ALKPHOS, BILITOT, PROT, ALBUMIN in the last 168 hours. No results for input(s): LIPASE, AMYLASE in the last 168 hours. No results for input(s): AMMONIA in  the last 168 hours.  CBC:  Recent Labs Lab 09/01/16 0352 09/02/16 0540  WBC 8.4 7.0  HGB 15.3 15.7  HCT 47.5* 50.0*  MCV 75.9* 75.7*  PLT 100* 87*    Cardiac Enzymes:  Recent Labs Lab 09/01/16 0352 09/01/16 0751 09/01/16 1408 09/01/16 1818  TROPONINI 0.07* 0.14* 0.13* 0.15*    BNP: Invalid input(s): POCBNP  CBG:  Recent Labs Lab 09/02/16 0913  GLUCAP 144*    Microbiology: No results found for this or any previous visit.  Coagulation Studies: No results for input(s): LABPROT, INR in the last 72 hours.  Urinalysis: No results for input(s): COLORURINE, LABSPEC, PHURINE, GLUCOSEU, HGBUR, BILIRUBINUR, KETONESUR, PROTEINUR, UROBILINOGEN, NITRITE, LEUKOCYTESUR in the last 72 hours.  Invalid input(s): APPERANCEUR    Imaging: Dg Chest 2 View  Result Date: 09/01/2016 CLINICAL DATA:  Shortness of breath and dizziness. History of lupus. EXAM: CHEST  2 VIEW COMPARISON:  Chest radiograph 03/13/2008 FINDINGS: There is increased, abnormal prominence of the left cardiac silhouette. No focal airspace consolidation or pulmonary edema. No pleural effusion or pneumothorax. IMPRESSION: 1. Increased prominence of the left cardiac silhouette may indicate underlying pulmonary hypertension. This has developed since the prior radiograph of 03/13/2008. Chest  CT or echocardiography may be helpful for further assessment. 2. No radiographic evidence of pneumonia. Electronically Signed   By: Ulyses Jarred M.D.   On: 09/01/2016 04:10   Ct Angio Chest Pe W And/or Wo Contrast  Result Date: 09/01/2016 CLINICAL DATA:  36 year old female with malaise for 1 week. Increasing left side chest pain for 4 days with shortness of breath. Initial encounter. EXAM: CT ANGIOGRAPHY CHEST WITH CONTRAST TECHNIQUE: Multidetector CT imaging of the chest was performed using the standard protocol during bolus administration of intravenous contrast. Multiplanar CT image reconstructions and MIPs were obtained to evaluate the vascular anatomy. CONTRAST:  75 mL Isovue 370 COMPARISON:  Chest radiographs 0358 hours today, and earlier. Prior chest CTA 02/01/2004. FINDINGS: Cardiovascular: Good contrast bolus timing in the pulmonary arterial tree. There is enlargement of the central pulmonary artery, with a diameter estimated at 40 mm (versus about 30 mm diameter of the adjacent ascending aorta. Respiratory motion artifact at both lung lung bases obscures detail of the subsegmental lower lobe pulmonary arteries. There is otherwise no pulmonary artery filling defect to suggest pulmonary embolus. A small pericardial effusion is new, measuring up to 11 mm in thickness along the anterior surface of the heart (series 4, image 70). There is mild new cardiomegaly compared to 2005. Grossly negative aorta, less arterial contrast today compared to the 2005 study. Mediastinum/Nodes: No mediastinal lymphadenopathy. Axillary lymph nodes appear stable since 2005. Lungs/Pleura: Small layering left pleural effusion. Major airways are patent. There is mild gas trapping in the superior segment of the left lower lobe. There is no abnormal pulmonary opacity aside from mild left lower lobe atelectasis. Upper Abdomen: Trace or small volume ascites in the upper abdomen, most apparent along the liver contour (series 4, image  82). Otherwise negative visualized liver (small calcified granuloma in the right lobe series 4, image 88), gallbladder, spleen, adrenal glands, pancreas, and renal upper poles. Negative visible bowel in the upper abdomen. Musculoskeletal: No osseous abnormality identified. Review of the MIP images confirms the above findings. IMPRESSION: 1. Central pulmonary artery enlargement has developed since 2005 suggesting pulmonary artery hypertension, but there is no pulmonary artery filling defect to indicate pulmonary embolus - note that subsegmental lower lobe branches are obscured by respiratory motion. 2. Additionally there is a  constellation of new small pericardial effusion, a small layering left pleural effusion, and a small volume of ascites in the upper abdomen. These findings are nonspecific and could be seen with anasarca, but consider also an acute systemic inflammatory process (which would include pericarditis). There is also mild cardiomegaly compared to 2005. 3. Negative lungs except for mild left lower lobe areas of gas trapping and atelectasis. Electronically Signed   By: Genevie Ann M.D.   On: 09/01/2016 08:26      Assessment & Plan: Pt is a 36 y.o. female with a PMHx of Systemic lupus erythematosus, hypertension, was Angel Lupus nephritis, mild alopecia, who was admitted to [facility] on 09/01/2016 for evaluation of shortness of breath and chest pain.   1.  Acute renal failure due to contrast exposure, indomethacin use, ? Worsening of lupus nephritis. 2.  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  Hypertension. 4.  Pericardial effusion. 5.  Hyperkalemia. 6.  Metabolic acidosis.  Plan:  The patient presents with an interesting and complicated case. She originally presented with chest pain which is been presumed to be secondary to pericarditis most likely secondary to underlying systemic lupus erythematosus. Patient had a CT scan with contrast performed earlier in the admission which is likely playing  some role now with acute renal failure. The patient also receiving indomethacin yesterday. She is now developed hyperkalemia and metabolic acidosis. We recommend discontinuation of indomethacin at this time. Discontinue 0.9 normal saline in favor of sodium bicarbonate drip 150 mEq at 125 cc per hour. We will also start the patient on patiromer for management of hyperkalemia.  4 pericarditis she has been transitioned to Solu-Medrol. Given the complexity of her case we recommend transition to a tertiary care center. This was discussed with hospitalist and he has requested transfer to [facility] where she normally receives rheumatology care. Thanks for consultation.

## 2016-09-02 NOTE — Consult Note (Signed)
PULMONARY / CRITICAL CARE MEDICINE   Name: Angela Li MRN: [id number] DOB: 19-Feb-1981    ADMISSION DATE:  09/01/2016 CONSULTATION DATE:  09/02/16  REFERRING MD:  Modena Nunnery   CHIEF COMPLAINT:  Dyspnea. New finding of severe pulmonary hypertension  HISTORY OF PRESENT ILLNESS:   36 yo [place] woman with diagnosis of SLE manifesting mostly as arthritis presented 08/22/16 to [facility] ED with chest pain and dyspnea of 1 week's duration. There was a concern for pericarditis and an echocardiogram was performed which revealed small pericardial effusion and severe PAH. A CTA chest was performed which revealed no PE. She is transferred to ICU/SDU with worsening acidosis, hyperkalemia and renal function. She is more dyspneic than day of admission. Otherwise no new complaints  PAST MEDICAL HISTORY :  She  has a past medical history of Collagen vascular disease ([place]); Hypertension; and Lupus.  PAST SURGICAL HISTORY: She  has no past surgical history on file.  No Known Allergies  No current facility-administered medications on file prior to encounter.    Current Outpatient Prescriptions on File Prior to Encounter  Medication Sig  . pantoprazole (PROTONIX) 40 MG tablet Take 40 mg by mouth 2 (two) times daily.   . predniSONE (DELTASONE) 5 MG tablet Take 5 mg by mouth 2 (two) times daily with a meal.   . Cholecalciferol (VITAMIN D3) 1000 UNITS CAPS Take 1,000 Int'l Units by mouth daily.    FAMILY HISTORY:  Her indicated that the status of her father is unknown.    SOCIAL HISTORY: She  reports that she has never smoked. She has never used smokeless tobacco. She reports that she does not drink alcohol or use drugs.  REVIEW OF SYSTEMS:     SUBJECTIVE:    VITAL SIGNS: BP (!) 131/108   Pulse (!) 106   Temp (!) 96.8 F (36 C) (Axillary)   Resp (!) 22   Ht _0  (1.651 m)   Wt 146 lb 2.6 oz (66.3 kg)   LMP 08/27/2016 (Approximate)   SpO2 98%   BMI 24.32 kg/m   HEMODYNAMICS:     VENTILATOR SETTINGS:    INTAKE / OUTPUT: I/O last 3 completed shifts: In: 120 [P.O.:120] Out: 400 [Urine:400]  PHYSICAL EXAMINATION: General: WDWN, mildly tachypneic, cognition intact Neuro: CNs, motor, sensory and DTRs all normal HEENT: NCAT, sclera white, oropharynx normal Cardiovascular: No obvious JVD, reg, + S3, no murmurs or rub noted Lungs: Faint L basilar crackles, no wheezes Abdomen: Soft, NT, +BS Ext: no pitting edema Skin: areas of erythema on back (due to "hot cupping")  LABS:  BMET  Recent Labs Lab 09/01/16 0352 09/02/16 0540 09/02/16 0722  NA 136 131* 130*  K 5.0 6.4* 6.4*  CL 113* 113* 113*  CO2 17* 8* 9*  BUN 26* 42* 46*  CREATININE 1.18* 1.87* 2.04*  GLUCOSE 97 137* 146*    Electrolytes  Recent Labs Lab 09/01/16 0352 09/02/16 0540 09/02/16 0722  CALCIUM 7.6* 7.3* 7.3*    CBC  Recent Labs Lab 09/01/16 0352 09/02/16 0540  WBC 8.4 7.0  HGB 15.3 15.7  HCT 47.5* 50.0*  PLT 100* 87*    Coag's No results for input(s): APTT, INR in the last 168 hours.  Sepsis Markers No results for input(s): LATICACIDVEN, PROCALCITON, O2SATVEN in the last 168 hours.  ABG No results for input(s): PHART, PCO2ART, PO2ART in the last 168 hours.  Liver Enzymes No results for input(s): AST, ALT, ALKPHOS, BILITOT, ALBUMIN in the last 168 hours.  Cardiac Enzymes  Recent Labs Lab 09/01/16 0751 09/01/16 1408 09/01/16 1818  TROPONINI 0.14* 0.13* 0.15*    Glucose  Recent Labs Lab 09/02/16 0913  GLUCAP 144*    CXR: cardiomegaly, prominent pulmonary trunk, lung files clear  CT chest: enlarged pulmonary arteries, no PE, very small L pleural effusion, small pericardial effusion, normal lung parenchyma  Echocardiogram: Normal LVEF, severe RV and RA overload, RVSP est 100 mmHg   ASSESSMENT: 1) New finding of very severe PAH - this is newly detected but certainly has been present for some time. She has now reached a point of decompensation as  evidenced by the failing R ventricle. This is almost certainly group 1 PAH due to SLE and warrants urgent treatment 2) AKI with oliguria, metabolic acidosis and hyperkalemia. The AKI could be lupus nephritis or cardiorenal related to severe PAH and consequent low cardiac output 3) The dyspnea with which she presented is likely explained by [place]. The acute worsening in dyspnea this AM is due to acidosis and increased Ve demands 4) Hypertension - after transfer to ICU/SDU, I was called by RN for BP 130/105  PLAN/REC: 1) Supplemental O2 2) Agree with HCO3 gtt 3) might require urgent HD - if so, would be very cautious with any volume removal as she is likely very preload dependent 4) Nicardipine gtt (which might help reduce pulmonary vascular resistance as well) 5) Initiate sildenafil for group I PAH - she has technically not undergone the complete eval to be initiating therapy but this seems to be an urgency and is warranted, I think 6) Agree with systemic steroids 7) Given the complexity of her acute illness, agree with plan to transfer to [facility]. If the pulmonary hypertension persists and continues to be a major component of her acute illness, could consider epoprostenol therapy or inhaled NO  Merton Border, MD PCCM service Mobile [phone] Pager [phone] 09/02/2016, 11:44 AM

## 2016-09-02 NOTE — Progress Notes (Signed)
[facility] called to notify this RN that pt had bed. Room number 354. Awaiting case manager to arrange transport to [facility]. Physician updated.

## 2016-09-02 NOTE — Progress Notes (Signed)
Spoke with [facility] transport staff, and Academic librarian. Gave hand off report. Awaiting ETA for [facility] transport team. Will continue to assess.

## 2016-09-02 NOTE — Progress Notes (Signed)
Patient status declined discussed care with dr. Anselm Jungling orders to transfer to Step down received. Report called to [facility] and patient transferred to CCU 2

## 2016-09-02 NOTE — Progress Notes (Signed)
[facility] Encounter Note  Patient: Angela Li / Admit Date: 09/01/2016 / Date of Encounter: 09/02/2016, 6:19 AM   Subjective: Patient continues to have some atypical chest discomfort nausea and headache multifactorial in nature including side effects of connective tissue disorder. The patient has had significant connective tissue disorder for quite some time with appropriate previous medication management Echocardiogram showing normal LV systolic function with pleural and pericardial effusion without evidence of tamponade not but severe pulmonary hypertension consistent with side effects of connective tissue disorder  Review of Systems: Positive for: Shortness of breath nausea headache Negative for: Vision change, hearing change, syncope, dizziness  vomiting,diarrhea, bloody stool, stomach pain, cough, congestion, diaphoresis, urinary frequency, urinary pain,skin lesions, skin rashes Others previously listed  Objective: Telemetry: Normal sinus rhythm Physical Exam: Blood pressure (!) 117/94, pulse (!) 103, temperature 98.6 F (37 C), temperature source Rectal, resp. rate 18, height _0  (1.651 m), weight 67.9 kg (149 lb 11.2 oz), last menstrual period 08/27/2016, SpO2 99 %. Body mass index is 24.91 kg/m. General: Well developed, well nourished, in no acute distress. Head: Normocephalic, atraumatic, sclera non-icteric, no xanthomas, nares are without discharge. Neck: No apparent masses Lungs: Normal respirations with no wheezes, no rhonchi, no rales , no crackles   Heart: Regular rate and rhythm, normal S1 S2, left sternal border murmur, no rub, no gallop, PMI is normal size and placement, carotid upstroke normal without bruit, jugular venous pressure normal Abdomen: Soft, non-tender, non-distended with normoactive bowel sounds. No hepatosplenomegaly. Abdominal aorta is normal size without bruit Extremities: No edema, no clubbing, no cyanosis, no ulcers,   Peripheral: 2+ radial, 2+ femoral, 2+ dorsal pedal pulses Neuro: Alert and oriented. Moves all extremities spontaneously. Psych:  Responds to questions appropriately with a normal affect.   Intake/Output Summary (Last 24 hours) at 09/02/16 2263 Last data filed at 09/02/16 0524  Gross per 24 hour  Intake              120 ml  Output              400 ml  Net             -280 ml    Inpatient Medications:  . cholecalciferol  1,000 Units Oral Daily  . indomethacin  75 mg Oral BID WC  . pantoprazole  40 mg Oral BID  . predniSONE  5 mg Oral BID WC  . sodium chloride flush  3 mL Intravenous Q12H   Infusions:   Labs:  Recent Labs  09/01/16 0352  NA 136  K 5.0  CL 113*  CO2 17*  GLUCOSE 97  BUN 26*  CREATININE 1.18*  CALCIUM 7.6*   No results for input(s): AST, ALT, ALKPHOS, BILITOT, PROT, ALBUMIN in the last 72 hours.  Recent Labs  09/01/16 0352  WBC 8.4  HGB 15.3  HCT 47.5*  MCV 75.9*  PLT 100*    Recent Labs  09/01/16 0352 09/01/16 0751 09/01/16 1408 09/01/16 1818  TROPONINI 0.07* 0.14* 0.13* 0.15*   Invalid input(s): POCBNP No results for input(s): HGBA1C in the last 72 hours.   Weights: Filed Weights   09/01/16 0342 09/01/16 1234  Weight: 65.8 kg (145 lb) 67.9 kg (149 lb 11.2 oz)     Radiology/Studies:  Dg Chest 2 View  Result Date: 09/01/2016 CLINICAL DATA:  Shortness of breath and dizziness. History of lupus. EXAM: CHEST  2 VIEW COMPARISON:  Chest radiograph 03/13/2008 FINDINGS: There is increased, abnormal prominence of the left  cardiac silhouette. No focal airspace consolidation or pulmonary edema. No pleural effusion or pneumothorax. IMPRESSION: 1. Increased prominence of the left cardiac silhouette may indicate underlying pulmonary hypertension. This has developed since the prior radiograph of 03/13/2008. Chest CT or echocardiography may be helpful for further assessment. 2. No radiographic evidence of pneumonia. Electronically Signed   By: Ulyses Jarred M.D.   On: 09/01/2016 04:10   Ct Angio Chest Pe W And/or Wo Contrast  Result Date: 09/01/2016 CLINICAL DATA:  36 year old female with malaise for 1 week. Increasing left side chest pain for 4 days with shortness of breath. Initial encounter. EXAM: CT ANGIOGRAPHY CHEST WITH CONTRAST TECHNIQUE: Multidetector CT imaging of the chest was performed using the standard protocol during bolus administration of intravenous contrast. Multiplanar CT image reconstructions and MIPs were obtained to evaluate the vascular anatomy. CONTRAST:  75 mL Isovue 370 COMPARISON:  Chest radiographs 0358 hours today, and earlier. Prior chest CTA 02/01/2004. FINDINGS: Cardiovascular: Good contrast bolus timing in the pulmonary arterial tree. There is enlargement of the central pulmonary artery, with a diameter estimated at 40 mm (versus about 30 mm diameter of the adjacent ascending aorta. Respiratory motion artifact at both lung lung bases obscures detail of the subsegmental lower lobe pulmonary arteries. There is otherwise no pulmonary artery filling defect to suggest pulmonary embolus. A small pericardial effusion is new, measuring up to 11 mm in thickness along the anterior surface of the heart (series 4, image 70). There is mild new cardiomegaly compared to 2005. Grossly negative aorta, less arterial contrast today compared to the 2005 study. Mediastinum/Nodes: No mediastinal lymphadenopathy. Axillary lymph nodes appear stable since 2005. Lungs/Pleura: Small layering left pleural effusion. Major airways are patent. There is mild gas trapping in the superior segment of the left lower lobe. There is no abnormal pulmonary opacity aside from mild left lower lobe atelectasis. Upper Abdomen: Trace or small volume ascites in the upper abdomen, most apparent along the liver contour (series 4, image 82). Otherwise negative visualized liver (small calcified granuloma in the right lobe series 4, image 88), gallbladder, spleen, adrenal  glands, pancreas, and renal upper poles. Negative visible bowel in the upper abdomen. Musculoskeletal: No osseous abnormality identified. Review of the MIP images confirms the above findings. IMPRESSION: 1. Central pulmonary artery enlargement has developed since 2005 suggesting pulmonary artery hypertension, but there is no pulmonary artery filling defect to indicate pulmonary embolus - note that subsegmental lower lobe branches are obscured by respiratory motion. 2. Additionally there is a constellation of new small pericardial effusion, a small layering left pleural effusion, and a small volume of ascites in the upper abdomen. These findings are nonspecific and could be seen with anasarca, but consider also an acute systemic inflammatory process (which would include pericarditis). There is also mild cardiomegaly compared to 2005. 3. Negative lungs except for mild left lower lobe areas of gas trapping and atelectasis. Electronically Signed   By: Genevie Ann M.D.   On: 09/01/2016 08:26     Assessment and Recommendation  36 y.o. female with systemic lupus erythematosus and/or other connective tissue disorder causing multiple significant symptoms listed above as well as other manifestations including severe pulmonary hypertension pericardial pleural effusion with ascites. Currently there is no evidence of coronary artery disease true angina requiring further intervention 1. Continue supportive care of side effects and other symptoms are likely associated with connective tissue disorder 2. Possible mild diuresis only if necessary for pleural effusion ascites and pericardial effusion if patient has no  evidence of concerns for chronic kidney disease and or hypotension 3. Primary treatment of exacerbation of connective tissue disorder as per endocrinology hematology 4. Further investigation and treatment of severe pulmonary hypertension associated with symptoms listed above and would consider right heart  catheterization to assess extent of pulmonary hypertension requiring medication management or infusion if necessary  Signed, Serafina Royals M.D. FACC

## 2016-09-02 NOTE — Progress Notes (Signed)
Updated Dr.Vachanni on pt's condition and increasing diastolic pressure. Dr. Judd Gaudier acknowledged and requested Dr. Leonidas Romberg be contacted.  Dr. Judd Gaudier notified this RN on pt transfer status. Accepting physician [place] at [facility]. Dr.Simmonds being paged.  Will continue to assess.

## 2016-09-02 NOTE — Care Management Note (Signed)
Case Management Note  Patient Details  Name: Angela Li MRN: [id number] Date of Birth: [birth year]  Subjective/Objective:      Dudley Major RN that nursing arranges patient  transportation in an acute to acute facility transfer. Provided the phone number to [facility] and a checklist of tasks that need to be completed prior to a patient  transfer to another acute facility.              Action/Plan:   Expected Discharge Date:  09/02/16               Expected Discharge Plan:     In-House Referral:     Discharge planning Services     Post Acute Care Choice:    Choice offered to:     DME Arranged:    DME Agency:     HH Arranged:    HH Agency:     Status of Service:     If discussed at [employer], dates discussed:    Additional Comments:  Ivan Lacher A, RN 09/02/2016, 1:36 PM

## 2016-09-02 NOTE — Discharge Summary (Signed)
[facility] NAME: Angela Li    MR#:  [id number]  DATE OF BIRTH:  1981/04/30  DATE OF ADMISSION:  09/01/2016 ADMITTING PHYSICIAN: Vaughan Basta, MD  DATE OF DISCHARGE: 09/02/2016  PRIMARY CARE PHYSICIAN: [facility]    ADMISSION DIAGNOSIS:  Systemic lupus erythematosus (SLE) with pericarditis, unspecified SLE type ([place]) [M32.12]  DISCHARGE DIAGNOSIS:  Principal Problem:   Pericarditis   Decompensated Pulmonary hypertension     Acute lupus flare  SECONDARY DIAGNOSIS:   Past Medical History:  Diagnosis Date  . Collagen vascular disease ([place])   . Hypertension   . Lupus     HOSPITAL COURSE:   * Acute renal failure   Metabolic acidosis   Hyperkalemia   Likely secondary to acute lupus flare    IV fluids with Bicarb   Kayexalate, Calcium inj IV, Dextrose+ Insulin and albuterol nebs given.   Recheck renal func and K in 4-5 hrs.   IV steroid one dose given.   She received One dose indomethacine yesterday 75 mg. Stopped now.   Appreciated nephro help.   Working on transfer to [facility]  * severe decompensated pulmonary hypertension   Underlying Lupus.   As per Echo- right heart pressure 100 mm Hg.      Appreciated Intensivist help   Transfer to step down unit for now.   May give sildenafil and will need help from specialized centers for handling pulm hypertension.   Trying to transfer to [facility].  Spoke to Dr. Wynonia Lawman at [facility]- he accepted pt to [facility] ICU.  Will transfer today.  DISCHARGE CONDITIONS:   Fair.  CONSULTS OBTAINED:  Treatment Team:  Corey Skains, MD Wilhelmina Mcardle, MD  DRUG ALLERGIES:  No Known Allergies  DISCHARGE MEDICATIONS:   Current Discharge Medication List    START taking these medications   Details  ondansetron (ZOFRAN) 4 MG/2ML SOLN injection Inject 2 mLs (4 mg total) into the vein every 6 (six) hours as needed for nausea or vomiting. Qty: 2 mL,  Refills: 0    sodium bicarbonate 150 mEq in dextrose 5 % 850 mL Inject 125 mL/hr into the vein continuous. Qty: 1000 mL, Refills: 0      CONTINUE these medications which have NOT CHANGED   Details  acetaminophen (TYLENOL) 325 MG tablet Take 650 mg by mouth every 4 (four) hours as needed.    pantoprazole (PROTONIX) 40 MG tablet Take 40 mg by mouth 2 (two) times daily.     predniSONE (DELTASONE) 5 MG tablet Take 5 mg by mouth 2 (two) times daily with a meal.     Cholecalciferol (VITAMIN D3) 1000 UNITS CAPS Take 1,000 Int'l Units by mouth daily.         DISCHARGE INSTRUCTIONS:    Follow recommendations of [facility].  If you experience worsening of your admission symptoms, develop shortness of breath, life threatening emergency, suicidal or homicidal thoughts you must seek medical attention immediately by calling 911 or calling your MD immediately  if symptoms less severe.  You Must read complete instructions/literature along with all the possible adverse reactions/side effects for all the Medicines you take and that have been prescribed to you. Take any new Medicines after you have completely understood and accept all the possible adverse reactions/side effects.   Please note  You were cared for by a hospitalist during your hospital stay. If you have any questions about your discharge medications or the care you received  while you were in the hospital after you are discharged, you can call the unit and asked to speak with the hospitalist on call if the hospitalist that took care of you is not available. Once you are discharged, your primary care physician will handle any further medical issues. Please note that NO REFILLS for any discharge medications will be authorized once you are discharged, as it is imperative that you return to your primary care physician (or establish a relationship with a primary care physician if you do not have one) for your aftercare needs so that they can reassess  your need for medications and monitor your lab values.    Today   CHIEF COMPLAINT:   Chief Complaint  Patient presents with  . Shortness of Breath    HISTORY OF PRESENT ILLNESS:  [name]  is a 36 y.o. female with a known history of Lupus, hypertension- started having some shoulder and chest pain last month, when she visited her primary care he told it is likely secondary to her lupus and did not make any changes in her medications. Her condition continued to get worse and she was getting short of breath with minimal exertion and getting low-grade fever at nighttime. Concerned with this she called her primary care again and she brought appointment last Tuesday with him. During that appointment he told maybe she has pneumonia and gave her some antibiotic to take which she is taking for last 3 days where she continued to get worse and having extreme fatigue and shortness of breath with minimal exertion so she decided to come to emergency room. CT scan of the chest is negative for any pneumonia or pneumonia more eye but it shows slight pericardial effusion and the patient has some T-wave inversions on EKG so she was suspected to have pericarditis and given his admission to hospitalist team as well as ER physician spoke to cardiologist on phone.   VITAL SIGNS:  Blood pressure (!) 131/108, pulse (!) 102, temperature (!) 96.8 F (36 C), temperature source Axillary, resp. rate (!) 23, height _0  (1.651 m), weight 66.3 kg (146 lb 2.6 oz), last menstrual period 08/27/2016, SpO2 98 %.  I/O:   Intake/Output Summary (Last 24 hours) at 09/02/16 1140 Last data filed at 09/02/16 0924  Gross per 24 hour  Intake              120 ml  Output              400 ml  Net             -280 ml    PHYSICAL EXAMINATION:   GENERAL:  36 y.o.-year-old patient lying in the bed with acute distress.  EYES: Pupils equal, round, reactive to light and accommodation. No scleral icterus. Extraocular muscles intact.   HEENT: Head atraumatic, normocephalic. Oropharynx and nasopharynx clear.  NECK:  Supple, no jugular venous distention. No thyroid enlargement, no tenderness.  LUNGS: Normal breath sounds bilaterally, no wheezing, rales,rhonchi or crepitation. positive use of accessory muscles of respiration. Fast breathing. CARDIOVASCULAR: S1, S2 fast, hyperdynamic sounds. No murmurs, rubs, or gallops.  ABDOMEN: Soft, nontender, nondistended. Bowel sounds present. No organomegaly or mass.  EXTREMITIES: some pedal edema, b/l lower extremities are very cold and some  Mottling present, very weak pulses. NEUROLOGIC: Cranial nerves II through XII are intact. Muscle strength 5/5 in all extremities. Sensation intact. Gait not checked.  PSYCHIATRIC: The patient is alert and oriented x 3.  SKIN: No obvious rash,  lesion, or ulcer.   DATA REVIEW:   CBC  Recent Labs Lab 09/02/16 0540  WBC 7.0  HGB 15.7  HCT 50.0*  PLT 87*    Chemistries   Recent Labs Lab 09/02/16 0722  NA 130*  K 6.4*  CL 113*  CO2 9*  GLUCOSE 146*  BUN 46*  CREATININE 2.04*  CALCIUM 7.3*    Cardiac Enzymes  Recent Labs Lab 09/01/16 1818  TROPONINI 0.15*    Microbiology Results  No results found for this or any previous visit.  RADIOLOGY:  Dg Chest 2 View  Result Date: 09/01/2016 CLINICAL DATA:  Shortness of breath and dizziness. History of lupus. EXAM: CHEST  2 VIEW COMPARISON:  Chest radiograph 03/13/2008 FINDINGS: There is increased, abnormal prominence of the left cardiac silhouette. No focal airspace consolidation or pulmonary edema. No pleural effusion or pneumothorax. IMPRESSION: 1. Increased prominence of the left cardiac silhouette may indicate underlying pulmonary hypertension. This has developed since the prior radiograph of 03/13/2008. Chest CT or echocardiography may be helpful for further assessment. 2. No radiographic evidence of pneumonia. Electronically Signed   By: Ulyses Jarred M.D.   On: 09/01/2016 04:10    Ct Angio Chest Pe W And/or Wo Contrast  Result Date: 09/01/2016 CLINICAL DATA:  36 year old female with malaise for 1 week. Increasing left side chest pain for 4 days with shortness of breath. Initial encounter. EXAM: CT ANGIOGRAPHY CHEST WITH CONTRAST TECHNIQUE: Multidetector CT imaging of the chest was performed using the standard protocol during bolus administration of intravenous contrast. Multiplanar CT image reconstructions and MIPs were obtained to evaluate the vascular anatomy. CONTRAST:  75 mL Isovue 370 COMPARISON:  Chest radiographs 0358 hours today, and earlier. Prior chest CTA 02/01/2004. FINDINGS: Cardiovascular: Good contrast bolus timing in the pulmonary arterial tree. There is enlargement of the central pulmonary artery, with a diameter estimated at 40 mm (versus about 30 mm diameter of the adjacent ascending aorta. Respiratory motion artifact at both lung lung bases obscures detail of the subsegmental lower lobe pulmonary arteries. There is otherwise no pulmonary artery filling defect to suggest pulmonary embolus. A small pericardial effusion is new, measuring up to 11 mm in thickness along the anterior surface of the heart (series 4, image 70). There is mild new cardiomegaly compared to 2005. Grossly negative aorta, less arterial contrast today compared to the 2005 study. Mediastinum/Nodes: No mediastinal lymphadenopathy. Axillary lymph nodes appear stable since 2005. Lungs/Pleura: Small layering left pleural effusion. Major airways are patent. There is mild gas trapping in the superior segment of the left lower lobe. There is no abnormal pulmonary opacity aside from mild left lower lobe atelectasis. Upper Abdomen: Trace or small volume ascites in the upper abdomen, most apparent along the liver contour (series 4, image 82). Otherwise negative visualized liver (small calcified granuloma in the right lobe series 4, image 88), gallbladder, spleen, adrenal glands, pancreas, and renal upper  poles. Negative visible bowel in the upper abdomen. Musculoskeletal: No osseous abnormality identified. Review of the MIP images confirms the above findings. IMPRESSION: 1. Central pulmonary artery enlargement has developed since 2005 suggesting pulmonary artery hypertension, but there is no pulmonary artery filling defect to indicate pulmonary embolus - note that subsegmental lower lobe branches are obscured by respiratory motion. 2. Additionally there is a constellation of new small pericardial effusion, a small layering left pleural effusion, and a small volume of ascites in the upper abdomen. These findings are nonspecific and could be seen with anasarca, but consider also an acute  systemic inflammatory process (which would include pericarditis). There is also mild cardiomegaly compared to 2005. 3. Negative lungs except for mild left lower lobe areas of gas trapping and atelectasis. Electronically Signed   By: Genevie Ann M.D.   On: 09/01/2016 08:26    EKG:   Orders placed or performed during the hospital encounter of 09/01/16  . ED EKG  . ED EKG  . EKG 12-Lead  . EKG 12-Lead      Management plans discussed with the patient, family and they are in agreement.  CODE STATUS:     Code Status Orders        Start     Ordered   09/01/16 1249  Full code  Continuous     09/01/16 1248    Code Status History    Date Active Date Inactive Code Status Order ID Comments User Context   This patient has a current code status but no historical code status.      TOTAL TIME TAKING CARE OF THIS PATIENT: 45 minutes.    Vaughan Basta M.D on 09/02/2016 at 11:40 AM  Between 7am to 6pm - Pager - [phone]  After 6pm go to [email or web address] - password [email or web address] [place] [facility]  Office  [phone]  CC: Primary care physician; [facility]   Note: This dictation was prepared with Dragon dictation along with smaller phrase technology. Any transcriptional  errors that result from this process are unintentional.

## 2016-09-04 LAB — GLUCOSE, CAPILLARY: GLUCOSE-CAPILLARY: 145 mg/dL — AB (ref 65–99)

## 2016-09-07 LAB — CULTURE, BLOOD (ROUTINE X 2)
CULTURE: NO GROWTH
Culture: NO GROWTH

## 2016-09-12 DIAGNOSIS — F4321 Adjustment disorder with depressed mood: Secondary | ICD-10-CM | POA: Insufficient documentation

## 2016-09-12 DIAGNOSIS — I2721 Secondary pulmonary arterial hypertension: Secondary | ICD-10-CM | POA: Insufficient documentation

## 2016-11-21 ENCOUNTER — Encounter: Payer: Medicaid Other | Attending: Pulmonary Disease | Admitting: *Deleted

## 2016-11-21 VITALS — Ht 65.0 in | Wt 130.9 lb

## 2016-11-21 DIAGNOSIS — I2721 Secondary pulmonary arterial hypertension: Secondary | ICD-10-CM | POA: Diagnosis present

## 2016-11-21 NOTE — Patient Instructions (Signed)
Patient Instructions  Patient Details  Name: Angela Li MRN: [id number] Date of Birth: 16-Feb-1981 Referring Provider:  Daphane Shepherd, MD  Below are the personal goals you chose as well as exercise and nutrition goals. Our goal is to help you keep on track towards obtaining and maintaining your goals. We will be discussing your progress on these goals with you throughout the program.  Initial Exercise Prescription:     Initial Exercise Prescription - 11/21/16 1200      Date of Initial Exercise RX and Referring Provider   Date 11/21/16   Referring Provider [place]     Treadmill   MPH 2   Grade 0.5   Minutes 15   METs 2.67     Recumbant Bike   Level 1   RPM 60   Minutes 15   METs 2.5     Recumbant Elliptical   Level 1   RPM 50   Minutes 15   METs 2.5     REL-XR   Level 2   Speed 50   Minutes 15   METs 2.5     Prescription Details   Frequency (times per week) 3   Duration Progress to 45 minutes of aerobic exercise without signs/symptoms of physical distress     Intensity   THRR 40-80% of Max Heartrate 120-163   Ratings of Perceived Exertion 11-13   Perceived Dyspnea 0-4     Resistance Training   Training Prescription Yes   Weight 2   Reps 10-15      Exercise Goals: Frequency: Be able to perform aerobic exercise three times per week working toward 3-5 days per week.  Intensity: Work with a perceived exertion of 11 (fairly light) - 15 (hard) as tolerated. Follow your new exercise prescription and watch for changes in prescription as you progress with the program. Changes will be reviewed with you when they are made.  Duration: You should be able to do 30 minutes of continuous aerobic exercise in addition to a 5 minute warm-up and a 5 minute cool-down routine.  Nutrition Goals: Your personal nutrition goals will be established when you do your nutrition analysis with the dietician.  The following are nutrition guidelines to follow: Cholesterol <  238m/day Sodium < 15040mday Fiber: Women under 50 yrs - 25 grams per day  Personal Goals:     Personal Goals and Risk Factors at Admission - 11/21/16 1224      Core Components/Risk Factors/Patient Goals on Admission   Improve shortness of breath with ADL's Yes   Intervention Provide education, individualized exercise plan and daily activity instruction to help decrease symptoms of SOB with activities of daily living.   Expected Outcomes Short Term: Achieves a reduction of symptoms when performing activities of daily living.   Develop more efficient breathing techniques such as purse lipped breathing and diaphragmatic breathing; and practicing self-pacing with activity Yes   Intervention Provide education, demonstration and support about specific breathing techniuqes utilized for more efficient breathing. Include techniques such as pursed lipped breathing, diaphragmatic breathing and self-pacing activity.   Expected Outcomes Short Term: Participant will be able to demonstrate and use breathing techniques as needed throughout daily activities.      Tobacco Use Initial Evaluation: History  Smoking Status   Never Smoker  Smokeless Tobacco   Never Used    Copy of goals given to participant.

## 2016-11-21 NOTE — Progress Notes (Signed)
Pulmonary Individual Treatment Plan  Patient Details  Name: Angela Li MRN: [id number] Date of Birth: 1981/01/14 Referring Provider:     Pulmonary Rehab from 11/21/2016 in [facility] Cardiac and Pulmonary Rehab  Referring Provider  Levarge      Initial Encounter Date:    Pulmonary Rehab from 11/21/2016 in [facility] Cardiac and Pulmonary Rehab  Date  11/21/16  Referring Provider  Levarge      Visit Diagnosis: Pulmonary arterial hypertension  Patient's Home Medications on Admission:  Current Outpatient Prescriptions:    albuterol (PROVENTIL) (2.5 MG/3ML) 0.083% nebulizer solution, 2.5 mg., Disp: , Rfl:    azaTHIOprine (IMURAN) 50 MG tablet, Take 100 mg by mouth., Disp: , Rfl:    folic acid (FOLVITE) 1 MG tablet, Take 1 mg by mouth., Disp: , Rfl:    furosemide (LASIX) 20 MG tablet, Take 20 mg by mouth., Disp: , Rfl:    Macitentan 10 MG TABS, Take 10 mg by mouth., Disp: , Rfl:    magnesium oxide (MAG-OX) 400 MG tablet, Take 400 mg by mouth., Disp: , Rfl:    omeprazole (PRILOSEC) 40 MG capsule, Take 40 mg by mouth daily., Disp: , Rfl:    predniSONE (DELTASONE) 5 MG tablet, Take 5 mg by mouth 2 (two) times daily with a meal. , Disp: , Rfl:    sildenafil (REVATIO) 20 MG tablet, Take 20 mg by mouth., Disp: , Rfl:    Treprostinil (TYVASO) 0.6 MG/ML SOLN, Inhale into the lungs., Disp: , Rfl:    acetaminophen (TYLENOL) 325 MG tablet, Take 650 mg by mouth every 4 (four) hours as needed., Disp: , Rfl:    Cholecalciferol (VITAMIN D3) 1000 UNITS CAPS, Take 1,000 Int'l Units by mouth daily., Disp: , Rfl:    ondansetron (ZOFRAN) 4 MG/2ML SOLN injection, Inject 2 mLs (4 mg total) into the vein every 6 (six) hours as needed for nausea or vomiting. (Patient not taking: Reported on 11/21/2016), Disp: 2 mL, Rfl: 0   pantoprazole (PROTONIX) 40 MG tablet, Take 40 mg by mouth 2 (two) times daily. , Disp: , Rfl:    sodium bicarbonate 150 mEq in dextrose 5 % 850 mL, Inject 125 mL/hr into the vein continuous.  (Patient not taking: Reported on 11/21/2016), Disp: 1000 mL, Rfl: 0  Past Medical History: Past Medical History:  Diagnosis Date   Collagen vascular disease ([place])    Hypertension    Lupus     Tobacco Use: History  Smoking Status   Never Smoker  Smokeless Tobacco   Never Used    Labs: Recent Review Flowsheet Data    Labs for ITP Cardiac and Pulmonary Rehab 03/13/2008   TCO2 24       ADL UCSD:     Pulmonary Assessment Scores    Row Name 11/21/16 1229         ADL UCSD   ADL Phase Entry     SOB Score total 33     Rest 0     Walk 0     Stairs 1     Bath 0     Dress 0     Shop 4        Pulmonary Function Assessment:     Pulmonary Function Assessment - 11/21/16 1318      Initial Spirometry Results   FVC% 47 %   FEV1% 53 %   FEV1/FVC Ratio 92.79      Exercise Target Goals: Date: 11/21/16  Exercise Program Goal: Individual exercise prescription set with THRR,  safety & activity barriers. Participant demonstrates ability to understand and report RPE using BORG scale, to self-measure pulse accurately, and to acknowledge the importance of the exercise prescription.  Exercise Prescription Goal: Starting with aerobic activity 30 plus minutes a day, 3 days per week for initial exercise prescription. Provide home exercise prescription and guidelines that participant acknowledges understanding prior to discharge.  Activity Barriers & Risk Stratification:   6 Minute Walk:     6 Minute Walk    Row Name 11/21/16 1237         6 Minute Walk   Distance 1235 feet     Walk Time 6 minutes     # of Rest Breaks 0     MPH 2.3     METS 4.7     RPE 11     Perceived Dyspnea  0     VO2 Peak 16.5     Symptoms No     Resting HR 77 bpm     Resting BP 114/72     Max Ex. HR 78 bpm     Max Ex. BP 124/68       Oxygen Initial Assessment:     Oxygen Initial Assessment - 11/21/16 1214      Home Oxygen   Home Oxygen Device None   Sleep Oxygen Prescription  None   Home Exercise Oxygen Prescription None   Home at Rest Exercise Oxygen Prescription None     Initial 6 min Walk   Oxygen Used None      Oxygen Re-Evaluation:   Oxygen Discharge (Final Oxygen Re-Evaluation):   Initial Exercise Prescription:     Initial Exercise Prescription - 11/21/16 1200      Date of Initial Exercise RX and Referring Provider   Date 11/21/16   Referring Provider Levarge     Treadmill   MPH 2   Grade 0.5   Minutes 15   METs 2.67     Recumbant Bike   Level 1   RPM 60   Minutes 15   METs 2.5     Recumbant Elliptical   Level 1   RPM 50   Minutes 15   METs 2.5     REL-XR   Level 2   Speed 50   Minutes 15   METs 2.5     Prescription Details   Frequency (times per week) 3   Duration Progress to 45 minutes of aerobic exercise without signs/symptoms of physical distress     Intensity   THRR 40-80% of Max Heartrate 120-163   Ratings of Perceived Exertion 11-13   Perceived Dyspnea 0-4     Resistance Training   Training Prescription Yes   Weight 2   Reps 10-15      Perform Capillary Blood Glucose checks as needed.  Exercise Prescription Changes:   Exercise Comments:   Exercise Goals and Review:   Exercise Goals Re-Evaluation :   Discharge Exercise Prescription (Final Exercise Prescription Changes):   Nutrition:  Target Goals: Understanding of nutrition guidelines, daily intake of sodium <1573m, cholesterol <2041m calories 30% from fat and 7% or less from saturated fats, daily to have 5 or more servings of fruits and vegetables.  Biometrics:     Pre Biometrics - 11/21/16 1236      Pre Biometrics   Height _0  (1.651 m)   Weight 130 lb 14.4 oz (59.4 kg)   Waist Circumference 28.5 inches   Hip Circumference 36.5 inches   Waist to Hip Ratio  0.78 %   BMI (Calculated) 21.8       Nutrition Therapy Plan and Nutrition Goals:   Nutrition Discharge: Rate Your Plate Scores:   Nutrition Goals  Re-Evaluation:   Nutrition Goals Discharge (Final Nutrition Goals Re-Evaluation):   Psychosocial: Target Goals: Acknowledge presence or absence of significant depression and/or stress, maximize coping skills, provide positive support system. Participant is able to verbalize types and ability to use techniques and skills needed for reducing stress and depression.   Initial Review & Psychosocial Screening:     Initial Psych Review & Screening - 11/21/16 1225      Initial Review   Current issues with None Identified     Family Dynamics   Good Support System? Yes  Family Fiancee friends     Barriers   Psychosocial barriers to participate in program There are no identifiable barriers or psychosocial needs.;The patient should benefit from training in stress management and relaxation.     Screening Interventions   Interventions Encouraged to exercise      Quality of Life Scores:     Quality of Life - 11/21/16 1225      Quality of Life Scores   Health/Function Pre 20.44 %   Socioeconomic Pre 20.25 %   Psych/Spiritual Pre 20.14 %   Family Pre 21 %   GLOBAL Pre 20.42 %      PHQ-9: Recent Review Flowsheet Data    Depression screen [facility] 2/9 11/21/2016   Decreased Interest 2   Down, Depressed, Hopeless 0   PHQ - 2 Score 2   Altered sleeping 0   Tired, decreased energy 2   Change in appetite 0   Feeling bad or failure about yourself  0   Trouble concentrating 0   Moving slowly or fidgety/restless 0   Suicidal thoughts 0   PHQ-9 Score 4   Difficult doing work/chores Not difficult at all     Interpretation of Total Score  Total Score Depression Severity:  1-4 = Minimal depression, 5-9 = Mild depression, 10-14 = Moderate depression, 15-19 = Moderately severe depression, 20-27 = Severe depression   Psychosocial Evaluation and Intervention:   Psychosocial Re-Evaluation:   Psychosocial Discharge (Final Psychosocial Re-Evaluation):   Education: Education Goals:  Education classes will be provided on a weekly basis, covering required topics. Participant will state understanding/return demonstration of topics presented.  Learning Barriers/Preferences:     Learning Barriers/Preferences - 11/21/16 1227      Learning Barriers/Preferences   Learning Barriers None   Learning Preferences None      Education Topics: Initial Evaluation Education: - Verbal, written and demonstration of respiratory meds, RPE/PD scales, oximetry and breathing techniques. Instruction on use of nebulizers and MDIs: cleaning and proper use, rinsing mouth with steroid doses and importance of monitoring MDI activations.   Pulmonary Rehab from 11/21/2016 in [facility] Cardiac and Pulmonary Rehab  Date  11/21/16  Educator  SB  Instruction Review Code  2- meets goals/outcomes      General Nutrition Guidelines/Fats and Fiber: -Group instruction provided by verbal, written material, models and posters to present the general guidelines for heart healthy nutrition. Gives an explanation and review of dietary fats and fiber.   Controlling Sodium/Reading Food Labels: -Group verbal and written material supporting the discussion of sodium use in heart healthy nutrition. Review and explanation with models, verbal and written materials for utilization of the food label.   Exercise Physiology & Risk Factors: - Group verbal and written instruction with models to review the  exercise physiology of the cardiovascular system and associated critical values. Details cardiovascular disease risk factors and the goals associated with each risk factor.   Aerobic Exercise & Resistance Training: - Gives group verbal and written discussion on the health impact of inactivity. On the components of aerobic and resistive training programs and the benefits of this training and how to safely progress through these programs.   Flexibility, Balance, General Exercise Guidelines: - Provides group verbal and written  instruction on the benefits of flexibility and balance training programs. Provides general exercise guidelines with specific guidelines to those with heart or lung disease. Demonstration and skill practice provided.   Stress Management: - Provides group verbal and written instruction about the health risks of elevated stress, cause of high stress, and healthy ways to reduce stress.   Depression: - Provides group verbal and written instruction on the correlation between heart/lung disease and depressed mood, treatment options, and the stigmas associated with seeking treatment.   Exercise & Equipment Safety: - Individual verbal instruction and demonstration of equipment use and safety with use of the equipment.   Pulmonary Rehab from 11/21/2016 in [facility] Cardiac and Pulmonary Rehab  Date  11/21/16  Educator  Sb  Instruction Review Code  2- meets goals/outcomes      Infection Prevention: - Provides verbal and written material to individual with discussion of infection control including proper hand washing and proper equipment cleaning during exercise session.   Pulmonary Rehab from 11/21/2016 in [facility] Cardiac and Pulmonary Rehab  Date  11/21/16  Educator  SB  Instruction Review Code  2- meets goals/outcomes      Falls Prevention: - Provides verbal and written material to individual with discussion of falls prevention and safety.   Pulmonary Rehab from 11/21/2016 in [facility] Cardiac and Pulmonary Rehab  Date  11/21/16  Educator  SB  Instruction Review Code  2- meets goals/outcomes      Diabetes: - Individual verbal and written instruction to review signs/symptoms of diabetes, desired ranges of glucose level fasting, after meals and with exercise. Advice that pre and post exercise glucose checks will be done for 3 sessions at entry of program.   Chronic Lung Diseases: - Group verbal and written instruction to review new updates, new respiratory medications, new advancements in procedures and  treatments. Provide informative websites and "800" numbers of self-education.   Lung Procedures: - Group verbal and written instruction to describe testing methods done to diagnose lung disease. Review the outcome of test results. Describe the treatment choices: Pulmonary Function Tests, ABGs and oximetry.   Energy Conservation: - Provide group verbal and written instruction for methods to conserve energy, plan and organize activities. Instruct on pacing techniques, use of adaptive equipment and posture/positioning to relieve shortness of breath.   Triggers: - Group verbal and written instruction to review types of environmental controls: home humidity, furnaces, filters, dust mite/pet prevention, HEPA vacuums. To discuss weather changes, air quality and the benefits of nasal washing.   Exacerbations: - Group verbal and written instruction to provide: warning signs, infection symptoms, calling MD promptly, preventive modes, and value of vaccinations. Review: effective airway clearance, coughing and/or vibration techniques. Create an Sports administrator.   Oxygen: - Individual and group verbal and written instruction on oxygen therapy. Includes supplement oxygen, available portable oxygen systems, continuous and intermittent flow rates, oxygen safety, concentrators, and Medicare reimbursement for oxygen.   Respiratory Medications: - Group verbal and written instruction to review medications for lung disease. Drug class, frequency, complications, importance  of spacers, rinsing mouth after steroid MDI's, and proper cleaning methods for nebulizers.   AED/CPR: - Group verbal and written instruction with the use of models to demonstrate the basic use of the AED with the basic ABC's of resuscitation.   Breathing Retraining: - Provides individuals verbal and written instruction on purpose, frequency, and proper technique of diaphragmatic breathing and pursed-lipped breathing. Applies individual  practice skills.   Anatomy and Physiology of the Lungs: - Group verbal and written instruction with the use of models to provide basic lung anatomy and physiology related to function, structure and complications of lung disease.   Heart Failure: - Group verbal and written instruction on the basics of heart failure: signs/symptoms, treatments, explanation of ejection fraction, enlarged heart and cardiomyopathy.   Sleep Apnea: - Individual verbal and written instruction to review Obstructive Sleep Apnea. Review of risk factors, methods for diagnosing and types of masks and machines for OSA.   Anxiety: - Provides group, verbal and written instruction on the correlation between heart/lung disease and anxiety, treatment options, and management of anxiety.   Relaxation: - Provides group, verbal and written instruction about the benefits of relaxation for patients with heart/lung disease. Also provides patients with examples of relaxation techniques.   Knowledge Questionnaire Score:     Knowledge Questionnaire Score - 11/21/16 1227      Knowledge Questionnaire Score   Pre Score 3/10  REviewed correct responses with Leilany. Advised her that the eduction sessions will teach her the answers. She verbalized understanding of the responses today       Core Components/Risk Factors/Patient Goals at Admission:     Personal Goals and Risk Factors at Admission - 11/21/16 1224      Core Components/Risk Factors/Patient Goals on Admission   Improve shortness of breath with ADL's Yes   Intervention Provide education, individualized exercise plan and daily activity instruction to help decrease symptoms of SOB with activities of daily living.   Expected Outcomes Short Term: Achieves a reduction of symptoms when performing activities of daily living.   Develop more efficient breathing techniques such as purse lipped breathing and diaphragmatic breathing; and practicing self-pacing with activity Yes    Intervention Provide education, demonstration and support about specific breathing techniuqes utilized for more efficient breathing. Include techniques such as pursed lipped breathing, diaphragmatic breathing and self-pacing activity.   Expected Outcomes Short Term: Participant will be able to demonstrate and use breathing techniques as needed throughout daily activities.      Core Components/Risk Factors/Patient Goals Review:    Core Components/Risk Factors/Patient Goals at Discharge (Final Review):    ITP Comments:     ITP Comments    Row Name 11/21/16 1210           ITP Comments Medical review completed. Initial ITP created  Diagnosis documentation can be found in [facility]ARE [facility] Admission 09/02/2016 [facility]          Comments: Ms Lawanda plans to start [facility] on 12/04/16 and attend 2 days/week.

## 2016-12-04 ENCOUNTER — Ambulatory Visit: Payer: Medicaid Other

## 2016-12-06 ENCOUNTER — Ambulatory Visit: Payer: Medicaid Other

## 2016-12-08 ENCOUNTER — Ambulatory Visit: Payer: Medicaid Other

## 2016-12-11 ENCOUNTER — Ambulatory Visit: Payer: Medicaid Other

## 2016-12-12 ENCOUNTER — Encounter: Payer: Self-pay | Admitting: Respiratory Therapy

## 2016-12-12 ENCOUNTER — Telehealth: Payer: Self-pay | Admitting: Respiratory Therapy

## 2016-12-12 NOTE — Telephone Encounter (Signed)
Called Ms Angela Li - she had her [facility] evaluation on 11/21/16 and was set up to start exercise on the 16th. Called to check on her for a new start date.

## 2016-12-13 ENCOUNTER — Ambulatory Visit: Payer: Medicaid Other

## 2016-12-15 ENCOUNTER — Ambulatory Visit: Payer: Medicaid Other

## 2016-12-18 ENCOUNTER — Ambulatory Visit: Payer: Medicaid Other

## 2016-12-20 ENCOUNTER — Ambulatory Visit: Payer: Medicaid Other

## 2016-12-22 ENCOUNTER — Ambulatory Visit: Payer: Medicaid Other

## 2016-12-25 ENCOUNTER — Ambulatory Visit: Payer: Medicaid Other

## 2016-12-25 ENCOUNTER — Encounter: Payer: Self-pay | Admitting: Respiratory Therapy

## 2016-12-25 DIAGNOSIS — I2721 Secondary pulmonary arterial hypertension: Secondary | ICD-10-CM

## 2016-12-25 NOTE — Progress Notes (Signed)
Pulmonary Individual Treatment Plan  Patient Details  Name: Angela Li MRN: [id number] Date of Birth: 1981/07/08 Referring Provider:     Pulmonary Rehab from 11/21/2016 in [facility] Cardiac and Pulmonary Rehab  Referring Provider  Levarge      Initial Encounter Date:    Pulmonary Rehab from 11/21/2016 in [facility] Cardiac and Pulmonary Rehab  Date  11/21/16  Referring Provider  Harrietta Guardian      Visit Diagnosis: Pulmonary arterial hypertension ([place])  Patient's Home Medications on Admission:  Current Outpatient Prescriptions:    acetaminophen (TYLENOL) 325 MG tablet, Take 650 mg by mouth every 4 (four) hours as needed., Disp: , Rfl:    albuterol (PROVENTIL) (2.5 MG/3ML) 0.083% nebulizer solution, 2.5 mg., Disp: , Rfl:    azaTHIOprine (IMURAN) 50 MG tablet, Take 100 mg by mouth., Disp: , Rfl:    Cholecalciferol (VITAMIN D3) 1000 UNITS CAPS, Take 1,000 Int'l Units by mouth daily., Disp: , Rfl:    folic acid (FOLVITE) 1 MG tablet, Take 1 mg by mouth., Disp: , Rfl:    furosemide (LASIX) 20 MG tablet, Take 20 mg by mouth., Disp: , Rfl:    Macitentan 10 MG TABS, Take 10 mg by mouth., Disp: , Rfl:    magnesium oxide (MAG-OX) 400 MG tablet, Take 400 mg by mouth., Disp: , Rfl:    omeprazole (PRILOSEC) 40 MG capsule, Take 40 mg by mouth daily., Disp: , Rfl:    ondansetron (ZOFRAN) 4 MG/2ML SOLN injection, Inject 2 mLs (4 mg total) into the vein every 6 (six) hours as needed for nausea or vomiting. (Patient not taking: Reported on 11/21/2016), Disp: 2 mL, Rfl: 0   pantoprazole (PROTONIX) 40 MG tablet, Take 40 mg by mouth 2 (two) times daily. , Disp: , Rfl:    predniSONE (DELTASONE) 5 MG tablet, Take 5 mg by mouth 2 (two) times daily with a meal. , Disp: , Rfl:    sildenafil (REVATIO) 20 MG tablet, Take 20 mg by mouth., Disp: , Rfl:    sodium bicarbonate 150 mEq in dextrose 5 % 850 mL, Inject 125 mL/hr into the vein continuous. (Patient not taking: Reported on 11/21/2016), Disp: 1000 mL, Rfl: 0    Treprostinil (TYVASO) 0.6 MG/ML SOLN, Inhale into the lungs., Disp: , Rfl:   Past Medical History: Past Medical History:  Diagnosis Date   Collagen vascular disease ([place])    Hypertension    Lupus     Tobacco Use: History  Smoking Status   Never Smoker  Smokeless Tobacco   Never Used    Labs: Recent Review Flowsheet Data    Labs for ITP Cardiac and Pulmonary Rehab 03/13/2008   TCO2 24       ADL UCSD:     Pulmonary Assessment Scores    Row Name 11/21/16 1229         ADL UCSD   ADL Phase Entry     SOB Score total 33     Rest 0     Walk 0     Stairs 1     Bath 0     Dress 0     Shop 4        Pulmonary Function Assessment:     Pulmonary Function Assessment - 11/21/16 1318      Initial Spirometry Results   FVC% 47 %   FEV1% 53 %   FEV1/FVC Ratio 92.79      Exercise Target Goals:    Exercise Program Goal: Individual exercise prescription set with  THRR, safety & activity barriers. Participant demonstrates ability to understand and report RPE using BORG scale, to self-measure pulse accurately, and to acknowledge the importance of the exercise prescription.  Exercise Prescription Goal: Starting with aerobic activity 30 plus minutes a day, 3 days per week for initial exercise prescription. Provide home exercise prescription and guidelines that participant acknowledges understanding prior to discharge.  Activity Barriers & Risk Stratification:   6 Minute Walk:     6 Minute Walk    Row Name 11/21/16 1237         6 Minute Walk   Distance 1235 feet     Walk Time 6 minutes     # of Rest Breaks 0     MPH 2.3     METS 4.7     RPE 11     Perceived Dyspnea  0     VO2 Peak 16.5     Symptoms No     Resting HR 77 bpm     Resting BP 114/72     Max Ex. HR 78 bpm     Max Ex. BP 124/68       Oxygen Initial Assessment:     Oxygen Initial Assessment - 11/21/16 1214      Home Oxygen   Home Oxygen Device None   Sleep Oxygen Prescription None    Home Exercise Oxygen Prescription None   Home at Rest Exercise Oxygen Prescription None     Initial 6 min Walk   Oxygen Used None      Oxygen Re-Evaluation:   Oxygen Discharge (Final Oxygen Re-Evaluation):   Initial Exercise Prescription:     Initial Exercise Prescription - 11/21/16 1200      Date of Initial Exercise RX and Referring Provider   Date 11/21/16   Referring Provider Levarge     Treadmill   MPH 2   Grade 0.5   Minutes 15   METs 2.67     Recumbant Bike   Level 1   RPM 60   Minutes 15   METs 2.5     Recumbant Elliptical   Level 1   RPM 50   Minutes 15   METs 2.5     REL-XR   Level 2   Speed 50   Minutes 15   METs 2.5     Prescription Details   Frequency (times per week) 3   Duration Progress to 45 minutes of aerobic exercise without signs/symptoms of physical distress     Intensity   THRR 40-80% of Max Heartrate 120-163   Ratings of Perceived Exertion 11-13   Perceived Dyspnea 0-4     Resistance Training   Training Prescription Yes   Weight 2   Reps 10-15      Perform Capillary Blood Glucose checks as needed.  Exercise Prescription Changes:   Exercise Comments:   Exercise Goals and Review:   Exercise Goals Re-Evaluation :   Discharge Exercise Prescription (Final Exercise Prescription Changes):   Nutrition:  Target Goals: Understanding of nutrition guidelines, daily intake of sodium <1527m, cholesterol <2054m calories 30% from fat and 7% or less from saturated fats, daily to have 5 or more servings of fruits and vegetables.  Biometrics:     Pre Biometrics - 11/21/16 1236      Pre Biometrics   Height _0  (1.651 m)   Weight 130 lb 14.4 oz (59.4 kg)   Waist Circumference 28.5 inches   Hip Circumference 36.5 inches   Waist to Hip  Ratio 0.78 %   BMI (Calculated) 21.8       Nutrition Therapy Plan and Nutrition Goals:   Nutrition Discharge: Rate Your Plate Scores:   Nutrition Goals  Re-Evaluation:   Nutrition Goals Discharge (Final Nutrition Goals Re-Evaluation):   Psychosocial: Target Goals: Acknowledge presence or absence of significant depression and/or stress, maximize coping skills, provide positive support system. Participant is able to verbalize types and ability to use techniques and skills needed for reducing stress and depression.   Initial Review & Psychosocial Screening:     Initial Psych Review & Screening - 11/21/16 1225      Initial Review   Current issues with None Identified     Family Dynamics   Good Support System? Yes  Family Fiancee friends     Barriers   Psychosocial barriers to participate in program There are no identifiable barriers or psychosocial needs.;The patient should benefit from training in stress management and relaxation.     Screening Interventions   Interventions Encouraged to exercise      Quality of Life Scores:     Quality of Life - 11/21/16 1225      Quality of Life Scores   Health/Function Pre 20.44 %   Socioeconomic Pre 20.25 %   Psych/Spiritual Pre 20.14 %   Family Pre 21 %   GLOBAL Pre 20.42 %      PHQ-9: Recent Review Flowsheet Data    Depression screen [facility] 2/9 11/21/2016   Decreased Interest 2   Down, Depressed, Hopeless 0   PHQ - 2 Score 2   Altered sleeping 0   Tired, decreased energy 2   Change in appetite 0   Feeling bad or failure about yourself  0   Trouble concentrating 0   Moving slowly or fidgety/restless 0   Suicidal thoughts 0   PHQ-9 Score 4   Difficult doing work/chores Not difficult at all     Interpretation of Total Score  Total Score Depression Severity:  1-4 = Minimal depression, 5-9 = Mild depression, 10-14 = Moderate depression, 15-19 = Moderately severe depression, 20-27 = Severe depression   Psychosocial Evaluation and Intervention:   Psychosocial Re-Evaluation:   Psychosocial Discharge (Final Psychosocial Re-Evaluation):   Education: Education Goals:  Education classes will be provided on a weekly basis, covering required topics. Participant will state understanding/return demonstration of topics presented.  Learning Barriers/Preferences:     Learning Barriers/Preferences - 11/21/16 1227      Learning Barriers/Preferences   Learning Barriers None   Learning Preferences None      Education Topics: Initial Evaluation Education: - Verbal, written and demonstration of respiratory meds, RPE/PD scales, oximetry and breathing techniques. Instruction on use of nebulizers and MDIs: cleaning and proper use, rinsing mouth with steroid doses and importance of monitoring MDI activations.   Pulmonary Rehab from 11/21/2016 in [facility] Cardiac and Pulmonary Rehab  Date  11/21/16  Educator  SB  Instruction Review Code  2- meets goals/outcomes      General Nutrition Guidelines/Fats and Fiber: -Group instruction provided by verbal, written material, models and posters to present the general guidelines for heart healthy nutrition. Gives an explanation and review of dietary fats and fiber.   Controlling Sodium/Reading Food Labels: -Group verbal and written material supporting the discussion of sodium use in heart healthy nutrition. Review and explanation with models, verbal and written materials for utilization of the food label.   Exercise Physiology & Risk Factors: - Group verbal and written instruction with models to review  the exercise physiology of the cardiovascular system and associated critical values. Details cardiovascular disease risk factors and the goals associated with each risk factor.   Aerobic Exercise & Resistance Training: - Gives group verbal and written discussion on the health impact of inactivity. On the components of aerobic and resistive training programs and the benefits of this training and how to safely progress through these programs.   Flexibility, Balance, General Exercise Guidelines: - Provides group verbal and written  instruction on the benefits of flexibility and balance training programs. Provides general exercise guidelines with specific guidelines to those with heart or lung disease. Demonstration and skill practice provided.   Stress Management: - Provides group verbal and written instruction about the health risks of elevated stress, cause of high stress, and healthy ways to reduce stress.   Depression: - Provides group verbal and written instruction on the correlation between heart/lung disease and depressed mood, treatment options, and the stigmas associated with seeking treatment.   Exercise & Equipment Safety: - Individual verbal instruction and demonstration of equipment use and safety with use of the equipment.   Pulmonary Rehab from 11/21/2016 in [facility] Cardiac and Pulmonary Rehab  Date  11/21/16  Educator  Sb  Instruction Review Code  2- meets goals/outcomes      Infection Prevention: - Provides verbal and written material to individual with discussion of infection control including proper hand washing and proper equipment cleaning during exercise session.   Pulmonary Rehab from 11/21/2016 in [facility] Cardiac and Pulmonary Rehab  Date  11/21/16  Educator  SB  Instruction Review Code  2- meets goals/outcomes      Falls Prevention: - Provides verbal and written material to individual with discussion of falls prevention and safety.   Pulmonary Rehab from 11/21/2016 in [facility] Cardiac and Pulmonary Rehab  Date  11/21/16  Educator  SB  Instruction Review Code  2- meets goals/outcomes      Diabetes: - Individual verbal and written instruction to review signs/symptoms of diabetes, desired ranges of glucose level fasting, after meals and with exercise. Advice that pre and post exercise glucose checks will be done for 3 sessions at entry of program.   Chronic Lung Diseases: - Group verbal and written instruction to review new updates, new respiratory medications, new advancements in procedures and  treatments. Provide informative websites and "800" numbers of self-education.   Lung Procedures: - Group verbal and written instruction to describe testing methods done to diagnose lung disease. Review the outcome of test results. Describe the treatment choices: Pulmonary Function Tests, ABGs and oximetry.   Energy Conservation: - Provide group verbal and written instruction for methods to conserve energy, plan and organize activities. Instruct on pacing techniques, use of adaptive equipment and posture/positioning to relieve shortness of breath.   Triggers: - Group verbal and written instruction to review types of environmental controls: home humidity, furnaces, filters, dust mite/pet prevention, HEPA vacuums. To discuss weather changes, air quality and the benefits of nasal washing.   Exacerbations: - Group verbal and written instruction to provide: warning signs, infection symptoms, calling MD promptly, preventive modes, and value of vaccinations. Review: effective airway clearance, coughing and/or vibration techniques. Create an Sports administrator.   Oxygen: - Individual and group verbal and written instruction on oxygen therapy. Includes supplement oxygen, available portable oxygen systems, continuous and intermittent flow rates, oxygen safety, concentrators, and Medicare reimbursement for oxygen.   Respiratory Medications: - Group verbal and written instruction to review medications for lung disease. Drug class, frequency, complications,  importance of spacers, rinsing mouth after steroid MDI's, and proper cleaning methods for nebulizers.   AED/CPR: - Group verbal and written instruction with the use of models to demonstrate the basic use of the AED with the basic ABC's of resuscitation.   Breathing Retraining: - Provides individuals verbal and written instruction on purpose, frequency, and proper technique of diaphragmatic breathing and pursed-lipped breathing. Applies individual  practice skills.   Anatomy and Physiology of the Lungs: - Group verbal and written instruction with the use of models to provide basic lung anatomy and physiology related to function, structure and complications of lung disease.   Heart Failure: - Group verbal and written instruction on the basics of heart failure: signs/symptoms, treatments, explanation of ejection fraction, enlarged heart and cardiomyopathy.   Sleep Apnea: - Individual verbal and written instruction to review Obstructive Sleep Apnea. Review of risk factors, methods for diagnosing and types of masks and machines for OSA.   Anxiety: - Provides group, verbal and written instruction on the correlation between heart/lung disease and anxiety, treatment options, and management of anxiety.   Relaxation: - Provides group, verbal and written instruction about the benefits of relaxation for patients with heart/lung disease. Also provides patients with examples of relaxation techniques.   Knowledge Questionnaire Score:     Knowledge Questionnaire Score - 11/21/16 1227      Knowledge Questionnaire Score   Pre Score 3/10  REviewed correct responses with Xochil. Advised her that the eduction sessions will teach her the answers. She verbalized understanding of the responses today       Core Components/Risk Factors/Patient Goals at Admission:     Personal Goals and Risk Factors at Admission - 11/21/16 1224      Core Components/Risk Factors/Patient Goals on Admission   Improve shortness of breath with ADL's Yes   Intervention Provide education, individualized exercise plan and daily activity instruction to help decrease symptoms of SOB with activities of daily living.   Expected Outcomes Short Term: Achieves a reduction of symptoms when performing activities of daily living.   Develop more efficient breathing techniques such as purse lipped breathing and diaphragmatic breathing; and practicing self-pacing with activity Yes    Intervention Provide education, demonstration and support about specific breathing techniuqes utilized for more efficient breathing. Include techniques such as pursed lipped breathing, diaphragmatic breathing and self-pacing activity.   Expected Outcomes Short Term: Participant will be able to demonstrate and use breathing techniques as needed throughout daily activities.      Core Components/Risk Factors/Patient Goals Review:    Core Components/Risk Factors/Patient Goals at Discharge (Final Review):    ITP Comments:     ITP Comments    Row Name 11/21/16 1210 12/25/16 0802         ITP Comments Medical review completed. Initial ITP created  Diagnosis documentation can be found in [facility] Admission 09/02/2016 [facility]  30 day note review by Dr Emily Filbert, Medical Director of [facility]         Comments: 30 day note review by Dr Emily Filbert, Medical Director of [facility]

## 2016-12-26 ENCOUNTER — Encounter: Payer: Self-pay | Admitting: Respiratory Therapy

## 2016-12-26 DIAGNOSIS — I2721 Secondary pulmonary arterial hypertension: Secondary | ICD-10-CM

## 2016-12-26 NOTE — Progress Notes (Signed)
Pulmonary Individual Treatment Plan  Patient Details  Name: Angela Li MRN: [id number] Date of Birth: 1981/07/08 Referring Provider:     Pulmonary Rehab from 11/21/2016 in [facility] Cardiac and Pulmonary Rehab  Referring Provider  [place]ge      Initial Encounter Date:    Pulmonary Rehab from 11/21/2016 in [facility] Cardiac and Pulmonary Rehab  Date  11/21/16  Referring Provider  [place]rietta Guardian      Visit Diagnosis: Pulmonary arterial hypertension ([place])  Patient's Home Medications on Admission:  Current Outpatient Prescriptions:    acetaminophen (TYLENOL) 325 MG tablet, Take 650 mg by mouth every 4 (four) hours as needed., Disp: , Rfl:    albuterol (PROVENTIL) (2.5 MG/3ML) 0.083% nebulizer solution, 2.5 mg., Disp: , Rfl:    azaTHIOprine (IMURAN) 50 MG tablet, Take 100 mg by mouth., Disp: , Rfl:    Cholecalciferol (VITAMIN D3) 1000 UNITS CAPS, Take 1,000 Int'l Units by mouth daily., Disp: , Rfl:    folic acid (FOLVITE) 1 MG tablet, Take 1 mg by mouth., Disp: , Rfl:    furosemide (LASIX) 20 MG tablet, Take 20 mg by mouth., Disp: , Rfl:    Macitentan 10 MG TABS, Take 10 mg by mouth., Disp: , Rfl:    magnesium oxide (MAG-OX) 400 MG tablet, Take 400 mg by mouth., Disp: , Rfl:    omeprazole (PRILOSEC) 40 MG capsule, Take 40 mg by mouth daily., Disp: , Rfl:    ondansetron (ZOFRAN) 4 MG/2ML SOLN injection, Inject 2 mLs (4 mg total) into the vein every 6 (six) hours as needed for nausea or vomiting. (Patient not taking: Reported on 11/21/2016), Disp: 2 mL, Rfl: 0   pantoprazole (PROTONIX) 40 MG tablet, Take 40 mg by mouth 2 (two) times daily. , Disp: , Rfl:    predniSONE (DELTASONE) 5 MG tablet, Take 5 mg by mouth 2 (two) times daily with a meal. , Disp: , Rfl:    sildenafil (REVATIO) 20 MG tablet, Take 20 mg by mouth., Disp: , Rfl:    sodium bicarbonate 150 mEq in dextrose 5 % 850 mL, Inject 125 mL/hr into the vein continuous. (Patient not taking: Reported on 11/21/2016), Disp: 1000 mL, Rfl: 0    Treprostinil (TYVASO) 0.6 MG/ML SOLN, Inhale into the lungs., Disp: , Rfl:   Past Medical History: Past Medical History:  Diagnosis Date   Collagen vascular disease ([place])    Hypertension    Lupus     Tobacco Use: History  Smoking Status   Never Smoker  Smokeless Tobacco   Never Used    Labs: Recent Review Flowsheet Data    Labs for ITP Cardiac and Pulmonary Rehab 03/13/2008   TCO2 24       ADL UCSD:     Pulmonary Assessment Scores    Row Name 11/21/16 1229         ADL UCSD   ADL Phase Entry     SOB Score total 33     Rest 0     Walk 0     Stairs 1     Bath 0     Dress 0     Shop 4        Pulmonary Function Assessment:     Pulmonary Function Assessment - 11/21/16 1318      Initial Spirometry Results   FVC% 47 %   FEV1% 53 %   FEV1/FVC Ratio 92.79      Exercise Target Goals:    Exercise Program Goal: Individual exercise prescription set with  THRR, safety & activity barriers. Participant demonstrates ability to understand and report RPE using BORG scale, to self-measure pulse accurately, and to acknowledge the importance of the exercise prescription.  Exercise Prescription Goal: Starting with aerobic activity 30 plus minutes a day, 3 days per week for initial exercise prescription. Provide home exercise prescription and guidelines that participant acknowledges understanding prior to discharge.  Activity Barriers & Risk Stratification:   6 Minute Walk:     6 Minute Walk    Row Name 11/21/16 1237         6 Minute Walk   Distance 1235 feet     Walk Time 6 minutes     # of Rest Breaks 0     MPH 2.3     METS 4.7     RPE 11     Perceived Dyspnea  0     VO2 Peak 16.5     Symptoms No     Resting HR 77 bpm     Resting BP 114/72     Max Ex. HR 78 bpm     Max Ex. BP 124/68       Oxygen Initial Assessment:     Oxygen Initial Assessment - 11/21/16 1214      Home Oxygen   Home Oxygen Device None   Sleep Oxygen Prescription None    Home Exercise Oxygen Prescription None   Home at Rest Exercise Oxygen Prescription None     Initial 6 min Walk   Oxygen Used None      Oxygen Re-Evaluation:   Oxygen Discharge (Final Oxygen Re-Evaluation):   Initial Exercise Prescription:     Initial Exercise Prescription - 11/21/16 1200      Date of Initial Exercise RX and Referring Provider   Date 11/21/16   Referring Provider Levarge     Treadmill   MPH 2   Grade 0.5   Minutes 15   METs 2.67     Recumbant Bike   Level 1   RPM 60   Minutes 15   METs 2.5     Recumbant Elliptical   Level 1   RPM 50   Minutes 15   METs 2.5     REL-XR   Level 2   Speed 50   Minutes 15   METs 2.5     Prescription Details   Frequency (times per week) 3   Duration Progress to 45 minutes of aerobic exercise without signs/symptoms of physical distress     Intensity   THRR 40-80% of Max Heartrate 120-163   Ratings of Perceived Exertion 11-13   Perceived Dyspnea 0-4     Resistance Training   Training Prescription Yes   Weight 2   Reps 10-15      Perform Capillary Blood Glucose checks as needed.  Exercise Prescription Changes:   Exercise Comments:   Exercise Goals and Review:   Exercise Goals Re-Evaluation :   Discharge Exercise Prescription (Final Exercise Prescription Changes):   Nutrition:  Target Goals: Understanding of nutrition guidelines, daily intake of sodium <1527m, cholesterol <2054m calories 30% from fat and 7% or less from saturated fats, daily to have 5 or more servings of fruits and vegetables.  Biometrics:     Pre Biometrics - 11/21/16 1236      Pre Biometrics   Height _0  (1.651 m)   Weight 130 lb 14.4 oz (59.4 kg)   Waist Circumference 28.5 inches   Hip Circumference 36.5 inches   Waist to Hip  Ratio 0.78 %   BMI (Calculated) 21.8       Nutrition Therapy Plan and Nutrition Goals:   Nutrition Discharge: Rate Your Plate Scores:   Nutrition Goals  Re-Evaluation:   Nutrition Goals Discharge (Final Nutrition Goals Re-Evaluation):   Psychosocial: Target Goals: Acknowledge presence or absence of significant depression and/or stress, maximize coping skills, provide positive support system. Participant is able to verbalize types and ability to use techniques and skills needed for reducing stress and depression.   Initial Review & Psychosocial Screening:     Initial Psych Review & Screening - 11/21/16 1225      Initial Review   Current issues with None Identified     Family Dynamics   Good Support System? Yes  Family Fiancee friends     Barriers   Psychosocial barriers to participate in program There are no identifiable barriers or psychosocial needs.;The patient should benefit from training in stress management and relaxation.     Screening Interventions   Interventions Encouraged to exercise      Quality of Life Scores:     Quality of Life - 11/21/16 1225      Quality of Life Scores   Health/Function Pre 20.44 %   Socioeconomic Pre 20.25 %   Psych/Spiritual Pre 20.14 %   Family Pre 21 %   GLOBAL Pre 20.42 %      PHQ-9: Recent Review Flowsheet Data    Depression screen [facility] 2/9 11/21/2016   Decreased Interest 2   Down, Depressed, Hopeless 0   PHQ - 2 Score 2   Altered sleeping 0   Tired, decreased energy 2   Change in appetite 0   Feeling bad or failure about yourself  0   Trouble concentrating 0   Moving slowly or fidgety/restless 0   Suicidal thoughts 0   PHQ-9 Score 4   Difficult doing work/chores Not difficult at all     Interpretation of Total Score  Total Score Depression Severity:  1-4 = Minimal depression, 5-9 = Mild depression, 10-14 = Moderate depression, 15-19 = Moderately severe depression, 20-27 = Severe depression   Psychosocial Evaluation and Intervention:   Psychosocial Re-Evaluation:   Psychosocial Discharge (Final Psychosocial Re-Evaluation):   Education: Education Goals:  Education classes will be provided on a weekly basis, covering required topics. Participant will state understanding/return demonstration of topics presented.  Learning Barriers/Preferences:     Learning Barriers/Preferences - 11/21/16 1227      Learning Barriers/Preferences   Learning Barriers None   Learning Preferences None      Education Topics: Initial Evaluation Education: - Verbal, written and demonstration of respiratory meds, RPE/PD scales, oximetry and breathing techniques. Instruction on use of nebulizers and MDIs: cleaning and proper use, rinsing mouth with steroid doses and importance of monitoring MDI activations.   Pulmonary Rehab from 11/21/2016 in [facility] Cardiac and Pulmonary Rehab  Date  11/21/16  Educator  SB  Instruction Review Code  2- meets goals/outcomes      General Nutrition Guidelines/Fats and Fiber: -Group instruction provided by verbal, written material, models and posters to present the general guidelines for heart healthy nutrition. Gives an explanation and review of dietary fats and fiber.   Controlling Sodium/Reading Food Labels: -Group verbal and written material supporting the discussion of sodium use in heart healthy nutrition. Review and explanation with models, verbal and written materials for utilization of the food label.   Exercise Physiology & Risk Factors: - Group verbal and written instruction with models to review  the exercise physiology of the cardiovascular system and associated critical values. Details cardiovascular disease risk factors and the goals associated with each risk factor.   Aerobic Exercise & Resistance Training: - Gives group verbal and written discussion on the health impact of inactivity. On the components of aerobic and resistive training programs and the benefits of this training and how to safely progress through these programs.   Flexibility, Balance, General Exercise Guidelines: - Provides group verbal and written  instruction on the benefits of flexibility and balance training programs. Provides general exercise guidelines with specific guidelines to those with heart or lung disease. Demonstration and skill practice provided.   Stress Management: - Provides group verbal and written instruction about the health risks of elevated stress, cause of high stress, and healthy ways to reduce stress.   Depression: - Provides group verbal and written instruction on the correlation between heart/lung disease and depressed mood, treatment options, and the stigmas associated with seeking treatment.   Exercise & Equipment Safety: - Individual verbal instruction and demonstration of equipment use and safety with use of the equipment.   Pulmonary Rehab from 11/21/2016 in [facility] Cardiac and Pulmonary Rehab  Date  11/21/16  Educator  Sb  Instruction Review Code  2- meets goals/outcomes      Infection Prevention: - Provides verbal and written material to individual with discussion of infection control including proper hand washing and proper equipment cleaning during exercise session.   Pulmonary Rehab from 11/21/2016 in [facility] Cardiac and Pulmonary Rehab  Date  11/21/16  Educator  SB  Instruction Review Code  2- meets goals/outcomes      Falls Prevention: - Provides verbal and written material to individual with discussion of falls prevention and safety.   Pulmonary Rehab from 11/21/2016 in [facility] Cardiac and Pulmonary Rehab  Date  11/21/16  Educator  SB  Instruction Review Code  2- meets goals/outcomes      Diabetes: - Individual verbal and written instruction to review signs/symptoms of diabetes, desired ranges of glucose level fasting, after meals and with exercise. Advice that pre and post exercise glucose checks will be done for 3 sessions at entry of program.   Chronic Lung Diseases: - Group verbal and written instruction to review new updates, new respiratory medications, new advancements in procedures and  treatments. Provide informative websites and "800" numbers of self-education.   Lung Procedures: - Group verbal and written instruction to describe testing methods done to diagnose lung disease. Review the outcome of test results. Describe the treatment choices: Pulmonary Function Tests, ABGs and oximetry.   Energy Conservation: - Provide group verbal and written instruction for methods to conserve energy, plan and organize activities. Instruct on pacing techniques, use of adaptive equipment and posture/positioning to relieve shortness of breath.   Triggers: - Group verbal and written instruction to review types of environmental controls: home humidity, furnaces, filters, dust mite/pet prevention, HEPA vacuums. To discuss weather changes, air quality and the benefits of nasal washing.   Exacerbations: - Group verbal and written instruction to provide: warning signs, infection symptoms, calling MD promptly, preventive modes, and value of vaccinations. Review: effective airway clearance, coughing and/or vibration techniques. Create an Sports administrator.   Oxygen: - Individual and group verbal and written instruction on oxygen therapy. Includes supplement oxygen, available portable oxygen systems, continuous and intermittent flow rates, oxygen safety, concentrators, and Medicare reimbursement for oxygen.   Respiratory Medications: - Group verbal and written instruction to review medications for lung disease. Drug class, frequency, complications,  importance of spacers, rinsing mouth after steroid MDI's, and proper cleaning methods for nebulizers.   AED/CPR: - Group verbal and written instruction with the use of models to demonstrate the basic use of the AED with the basic ABC's of resuscitation.   Breathing Retraining: - Provides individuals verbal and written instruction on purpose, frequency, and proper technique of diaphragmatic breathing and pursed-lipped breathing. Applies individual  practice skills.   Anatomy and Physiology of the Lungs: - Group verbal and written instruction with the use of models to provide basic lung anatomy and physiology related to function, structure and complications of lung disease.   Heart Failure: - Group verbal and written instruction on the basics of heart failure: signs/symptoms, treatments, explanation of ejection fraction, enlarged heart and cardiomyopathy.   Sleep Apnea: - Individual verbal and written instruction to review Obstructive Sleep Apnea. Review of risk factors, methods for diagnosing and types of masks and machines for OSA.   Anxiety: - Provides group, verbal and written instruction on the correlation between heart/lung disease and anxiety, treatment options, and management of anxiety.   Relaxation: - Provides group, verbal and written instruction about the benefits of relaxation for patients with heart/lung disease. Also provides patients with examples of relaxation techniques.   Knowledge Questionnaire Score:     Knowledge Questionnaire Score - 11/21/16 1227      Knowledge Questionnaire Score   Pre Score 3/10  REviewed correct responses with Armanda. Advised her that the eduction sessions will teach her the answers. She verbalized understanding of the responses today       Core Components/Risk Factors/Patient Goals at Admission:     Personal Goals and Risk Factors at Admission - 11/21/16 1224      Core Components/Risk Factors/Patient Goals on Admission   Improve shortness of breath with ADL's Yes   Intervention Provide education, individualized exercise plan and daily activity instruction to help decrease symptoms of SOB with activities of daily living.   Expected Outcomes Short Term: Achieves a reduction of symptoms when performing activities of daily living.   Develop more efficient breathing techniques such as purse lipped breathing and diaphragmatic breathing; and practicing self-pacing with activity Yes    Intervention Provide education, demonstration and support about specific breathing techniuqes utilized for more efficient breathing. Include techniques such as pursed lipped breathing, diaphragmatic breathing and self-pacing activity.   Expected Outcomes Short Term: Participant will be able to demonstrate and use breathing techniques as needed throughout daily activities.      Core Components/Risk Factors/Patient Goals Review:    Core Components/Risk Factors/Patient Goals at Discharge (Final Review):    ITP Comments:     ITP Comments    Row Name 11/21/16 1210 12/25/16 0802 12/25/16 0803 12/26/16 1235     ITP Comments Medical review completed. Initial ITP created  Diagnosis documentation can be found in [facility] Admission 09/02/2016 [facility]  30 day note review by Dr Emily Filbert, Medical Director of [facility] [place] on 12/12/16 to check with her on a new start date for [facility]. Her Medical evaluation was on 11/21/16 with the original start date set on 12/04/16. I have called a second time and again no response.  Called Ms Kirsty on 12/12/16 to check with her on a new start date for [facility]. Her Medical evaluation was on 11/21/16 with the original start date set on 12/04/16. I have called a second time and again no response.        Comments: Called Ms Kam on 12/12/16 to  check with her on a new start date for [facility]. Her Medical evaluation was on 11/21/16 with the original start date set on 12/04/16. I have called a second time and again no response. Discharging Ms Katiria from [employer].

## 2016-12-26 NOTE — Progress Notes (Signed)
Discharge Summary  Patient Details  Name: Angela Li MRN: [id number] Date of Birth: Apr 25, 1981 Referring Provider:     Pulmonary Rehab from 11/21/2016 in [facility] Cardiac and Pulmonary Rehab  Referring Provider  [place]       Number of Visits: 1 Reason for Discharge:  Early Exit:  Called Ms Keyani on 12/12/16 to check with her on a new start date for [facility]. Her Medical evaluation was on 11/21/16 with the original start date set on 12/04/16. I have called a second time and again no response.   Smoking History:  History  Smoking Status   Never Smoker  Smokeless Tobacco   Never Used    Diagnosis:  Pulmonary arterial hypertension ([place])  ADL UCSD:     Pulmonary Assessment Scores    Row Name 11/21/16 1229         ADL UCSD   ADL Phase Entry     SOB Score total 33     Rest 0     Walk 0     Stairs 1     Bath 0     Dress 0     Shop 4        Initial Exercise Prescription:     Initial Exercise Prescription - 11/21/16 1200      Date of Initial Exercise RX and Referring Provider   Date 11/21/16   Referring Provider Levarge     Treadmill   MPH 2   Grade 0.5   Minutes 15   METs 2.67     Recumbant Bike   Level 1   RPM 60   Minutes 15   METs 2.5     Recumbant Elliptical   Level 1   RPM 50   Minutes 15   METs 2.5     REL-XR   Level 2   Speed 50   Minutes 15   METs 2.5     Prescription Details   Frequency (times per week) 3   Duration Progress to 45 minutes of aerobic exercise without signs/symptoms of physical distress     Intensity   THRR 40-80% of Max Heartrate 120-163   Ratings of Perceived Exertion 11-13   Perceived Dyspnea 0-4     Resistance Training   Training Prescription Yes   Weight 2   Reps 10-15      Discharge Exercise Prescription (Final Exercise Prescription Changes):   Functional Capacity:     6 Minute Walk    Row Name 11/21/16 1237         6 Minute Walk   Distance 1235 feet     Walk Time 6 minutes     # of Rest Breaks  0     MPH 2.3     METS 4.7     RPE 11     Perceived Dyspnea  0     VO2 Peak 16.5     Symptoms No     Resting HR 77 bpm     Resting BP 114/72     Max Ex. HR 78 bpm     Max Ex. BP 124/68        Psychological, QOL, Others - Outcomes: PHQ 2/9: Depression screen PHQ 2/9 11/21/2016  Decreased Interest 2  Down, Depressed, Hopeless 0  PHQ - 2 Score 2  Altered sleeping 0  Tired, decreased energy 2  Change in appetite 0  Feeling bad or failure about yourself  0  Trouble concentrating 0  Moving slowly or fidgety/restless 0  Suicidal thoughts 0  PHQ-9 Score 4  Difficult doing work/chores Not difficult at all    Quality of Life:     Quality of Life - 11/21/16 1225      Quality of Life Scores   Health/Function Pre 20.44 %   Socioeconomic Pre 20.25 %   Psych/Spiritual Pre 20.14 %   Family Pre 21 %   GLOBAL Pre 20.42 %      Personal Goals: Goals established at orientation with interventions provided to work toward goal.     Personal Goals and Risk Factors at Admission - 11/21/16 1224      Core Components/Risk Factors/Patient Goals on Admission   Improve shortness of breath with ADL's Yes   Intervention Provide education, individualized exercise plan and daily activity instruction to help decrease symptoms of SOB with activities of daily living.   Expected Outcomes Short Term: Achieves a reduction of symptoms when performing activities of daily living.   Develop more efficient breathing techniques such as purse lipped breathing and diaphragmatic breathing; and practicing self-pacing with activity Yes   Intervention Provide education, demonstration and support about specific breathing techniuqes utilized for more efficient breathing. Include techniques such as pursed lipped breathing, diaphragmatic breathing and self-pacing activity.   Expected Outcomes Short Term: Participant will be able to demonstrate and use breathing techniques as needed throughout daily activities.        Personal Goals Discharge:   Nutrition & Weight - Outcomes:     Pre Biometrics - 11/21/16 1236      Pre Biometrics   Height _0  (1.651 m)   Weight 130 lb 14.4 oz (59.4 kg)   Waist Circumference 28.5 inches   Hip Circumference 36.5 inches   Waist to Hip Ratio 0.78 %   BMI (Calculated) 21.8       Nutrition:   Nutrition Discharge:   Education Questionnaire Score:     Knowledge Questionnaire Score - 11/21/16 1227      Knowledge Questionnaire Score   Pre Score 3/10  REviewed correct responses with Callahan. Advised her that the eduction sessions will teach her the answers. She verbalized understanding of the responses today      Goals reviewed with patient; copy given to patient.

## 2016-12-27 ENCOUNTER — Ambulatory Visit: Payer: Medicaid Other

## 2016-12-29 ENCOUNTER — Ambulatory Visit: Payer: Medicaid Other

## 2017-01-01 ENCOUNTER — Ambulatory Visit: Payer: Medicaid Other

## 2017-01-03 ENCOUNTER — Ambulatory Visit: Payer: Medicaid Other

## 2017-01-05 ENCOUNTER — Ambulatory Visit: Payer: Medicaid Other

## 2017-01-08 ENCOUNTER — Ambulatory Visit: Payer: Medicaid Other

## 2017-01-10 ENCOUNTER — Ambulatory Visit: Payer: Medicaid Other

## 2017-01-12 ENCOUNTER — Ambulatory Visit: Payer: Medicaid Other

## 2017-01-17 ENCOUNTER — Ambulatory Visit: Payer: Medicaid Other

## 2017-01-19 ENCOUNTER — Ambulatory Visit: Payer: Medicaid Other

## 2017-01-22 ENCOUNTER — Ambulatory Visit: Payer: Medicaid Other

## 2017-01-24 ENCOUNTER — Ambulatory Visit: Payer: Medicaid Other

## 2017-01-26 ENCOUNTER — Ambulatory Visit: Payer: Medicaid Other

## 2017-01-29 ENCOUNTER — Ambulatory Visit: Payer: Medicaid Other

## 2017-01-31 ENCOUNTER — Ambulatory Visit: Payer: Medicaid Other

## 2017-02-02 ENCOUNTER — Ambulatory Visit: Payer: Medicaid Other

## 2017-02-05 ENCOUNTER — Ambulatory Visit: Payer: Medicaid Other

## 2017-02-07 ENCOUNTER — Ambulatory Visit: Payer: Medicaid Other

## 2017-02-09 ENCOUNTER — Ambulatory Visit: Payer: Medicaid Other

## 2017-02-12 ENCOUNTER — Ambulatory Visit: Payer: Medicaid Other

## 2017-02-14 ENCOUNTER — Ambulatory Visit: Payer: Medicaid Other

## 2017-02-16 ENCOUNTER — Ambulatory Visit: Payer: Medicaid Other

## 2017-02-19 ENCOUNTER — Ambulatory Visit: Payer: Medicaid Other

## 2020-06-18 ENCOUNTER — Ambulatory Visit (LOCAL_COMMUNITY_HEALTH_CENTER): Payer: Medicaid Other

## 2020-06-18 ENCOUNTER — Other Ambulatory Visit: Payer: Self-pay

## 2020-06-18 DIAGNOSIS — Z23 Encounter for immunization: Secondary | ICD-10-CM

## 2021-05-31 ENCOUNTER — Encounter (HOSPITAL_BASED_OUTPATIENT_CLINIC_OR_DEPARTMENT_OTHER): Payer: Self-pay

## 2021-05-31 ENCOUNTER — Other Ambulatory Visit: Payer: Self-pay

## 2021-05-31 DIAGNOSIS — Z79899 Other long term (current) drug therapy: Secondary | ICD-10-CM | POA: Diagnosis not present

## 2021-05-31 DIAGNOSIS — R42 Dizziness and giddiness: Secondary | ICD-10-CM | POA: Insufficient documentation

## 2021-05-31 DIAGNOSIS — I10 Essential (primary) hypertension: Secondary | ICD-10-CM | POA: Insufficient documentation

## 2021-05-31 DIAGNOSIS — R519 Headache, unspecified: Secondary | ICD-10-CM | POA: Diagnosis not present

## 2021-05-31 NOTE — ED Triage Notes (Addendum)
Pt reports  dizziness -  states she feels like off balance when she walks - onset 1 month  - denies  fall /  trauma   States she been taking meclizine but no relief

## 2021-06-01 ENCOUNTER — Emergency Department (HOSPITAL_BASED_OUTPATIENT_CLINIC_OR_DEPARTMENT_OTHER)
Admission: EM | Admit: 2021-06-01 | Discharge: 2021-06-01 | Disposition: A | Discharge status: Home / Self Care | Payer: Medicaid Other | Attending: Emergency Medicine | Admitting: Emergency Medicine

## 2021-06-01 ENCOUNTER — Emergency Department (HOSPITAL_BASED_OUTPATIENT_CLINIC_OR_DEPARTMENT_OTHER): Payer: Medicaid Other

## 2021-06-01 DIAGNOSIS — R42 Dizziness and giddiness: Secondary | ICD-10-CM

## 2021-06-01 LAB — CBC WITH DIFFERENTIAL/PLATELET
Abs Immature Granulocytes: 0.02 10*3/uL (ref 0.00–0.07)
Basophils Absolute: 0 10*3/uL (ref 0.0–0.1)
Basophils Relative: 0 %
Eosinophils Absolute: 0 10*3/uL (ref 0.0–0.5)
Eosinophils Relative: 1 %
HCT: 41.6 % (ref 36.0–46.0)
Hemoglobin: 13.1 g/dL (ref 12.0–15.0)
Immature Granulocytes: 0 %
Lymphocytes Relative: 33 %
Lymphs Abs: 1.5 10*3/uL (ref 0.7–4.0)
MCH: 25.9 pg — ABNORMAL LOW (ref 26.0–34.0)
MCHC: 31.5 g/dL (ref 30.0–36.0)
MCV: 82.2 fL (ref 80.0–100.0)
Monocytes Absolute: 0.4 10*3/uL (ref 0.1–1.0)
Monocytes Relative: 8 %
Neutro Abs: 2.7 10*3/uL (ref 1.7–7.7)
Neutrophils Relative %: 58 %
Platelets: 173 10*3/uL (ref 150–400)
RBC: 5.06 MIL/uL (ref 3.87–5.11)
RDW: 14.1 % (ref 11.5–15.5)
WBC: 4.6 10*3/uL (ref 4.0–10.5)
nRBC: 0 % (ref 0.0–0.2)

## 2021-06-01 LAB — BASIC METABOLIC PANEL
Anion gap: 7 (ref 5–15)
BUN: 18 mg/dL (ref 6–20)
CO2: 24 mmol/L (ref 22–32)
Calcium: 8.9 mg/dL (ref 8.9–10.3)
Chloride: 104 mmol/L (ref 98–111)
Creatinine, Ser: 0.75 mg/dL (ref 0.44–1.00)
GFR, Estimated: >60 mL/min (ref >60)
Glucose, Bld: 79 mg/dL (ref 70–99)
Potassium: 4.6 mmol/L (ref 3.5–5.1)
Sodium: 135 mmol/L (ref 135–145)

## 2021-06-01 LAB — PREGNANCY, URINE: Preg Test, Ur: NEGATIVE

## 2021-06-01 MED ORDER — SODIUM CHLORIDE 0.9 % IV BOLUS
1000.0000 mL | Freq: Once | INTRAVENOUS | Status: AC
  Administered 2021-06-01: 1000 mL via INTRAVENOUS

## 2021-06-01 MED ORDER — PROCHLORPERAZINE EDISYLATE 10 MG/2ML IJ SOLN
10.0000 mg | Freq: Once | INTRAMUSCULAR | Status: AC
  Administered 2021-06-01: 10 mg via INTRAVENOUS
  Filled 2021-06-01: qty 2

## 2021-06-01 MED ORDER — KETOROLAC TROMETHAMINE 30 MG/ML IJ SOLN
30.0000 mg | Freq: Once | INTRAMUSCULAR | Status: AC
  Administered 2021-06-01: 30 mg via INTRAVENOUS
  Filled 2021-06-01: qty 1

## 2021-06-01 NOTE — Discharge Instructions (Addendum)
Continue meclizine as previously prescribed.  Follow-up with your primary doctor later today as scheduled, and return to the ER if symptoms significantly worsen or change.

## 2021-06-01 NOTE — ED Notes (Signed)
Patient transported to CT

## 2021-06-01 NOTE — ED Provider Notes (Signed)
[facility] EMERGENCY DEPT Provider Note   CSN: [id number] Arrival date & time: 05/31/21  2332     History Chief Complaint  Patient presents with   Dizziness    Angela Li is a 40 y.o. female.  Patient is a 40 year old female with past medical history of lupus and hypertension.  Patient presenting today with complaints of dizziness.  She describes a 1 month history of feeling off balance.  This seemed to begin shortly after an airplane trip over [place] weekend.  She describes a constant feeling of being off balance.  She tells me she was seen by ENT with similar complaints who did not feel as though this problem was not related to her ears or throat.  She was prescribed meclizine, however this is not helping.  The history is provided by the patient.  Dizziness Quality:  Imbalance Severity:  Moderate Onset quality:  Gradual Duration:  1 month Timing:  Constant Progression:  Worsening Chronicity:  New Context: not with head movement and not with loss of consciousness   Relieved by:  Nothing Worsened by:  Movement     Past Medical History:  Diagnosis Date   Collagen vascular disease ([place])    Hypertension    Lupus ([place])     Patient Active Problem List   Diagnosis Date Noted   Adjustment disorder with depressed mood 09/12/2016   Pulmonary arterial hypertension ([place]) 09/12/2016   Renal failure 09/02/2016   Pericarditis 09/01/2016   Thrombocytopenia ([place]) 01/25/2015   Lupus (systemic lupus erythematosus) ([place]) 10/21/2014    History reviewed. No pertinent surgical history.   OB History   No obstetric history on file.     Family History  Problem Relation Age of Onset   Diabetes Father     Social History   Tobacco Use   Smoking status: Never   Smokeless tobacco: Never  Substance Use Topics   Alcohol use: No   Drug use: No    Home Medications Prior to Admission medications   Medication Sig Start Date End Date Taking? Authorizing  Provider  acetaminophen (TYLENOL) 325 MG tablet Take 650 mg by mouth every 4 (four) hours as needed.    [provider]  Cholecalciferol (VITAMIN D3) 1000 UNITS CAPS Take 1,000 Int'l Units by mouth daily.    [provider]  furosemide (LASIX) 20 MG tablet Take 20 mg by mouth. 10/27/16 01/25/17  [provider]  Macitentan 10 MG TABS Take 10 mg by mouth. 10/07/16   [provider]  omeprazole (PRILOSEC) 40 MG capsule Take 40 mg by mouth daily.    [provider]  ondansetron (ZOFRAN) 4 MG/2ML SOLN injection Inject 2 mLs (4 mg total) into the vein every 6 (six) hours as needed for nausea or vomiting. Patient not taking: Reported on 11/21/2016 09/02/16   Vaughan Basta, MD  pantoprazole (PROTONIX) 40 MG tablet Take 40 mg by mouth 2 (two) times daily.     [provider]  predniSONE (DELTASONE) 5 MG tablet Take 5 mg by mouth 2 (two) times daily with a meal.  10/05/14   [provider]  sildenafil (REVATIO) 20 MG tablet Take 20 mg by mouth. 10/07/16   [provider]  sodium bicarbonate 150 mEq in dextrose 5 % 850 mL Inject 125 mL/hr into the vein continuous. Patient not taking: Reported on 11/21/2016 09/02/16   Vaughan Basta, MD  Treprostinil (TYVASO) 0.6 MG/ML SOLN Inhale into the lungs. 10/07/16   [provider]  Allergies    Lisinopril and Other  Review of Systems   Review of Systems  Neurological:  Positive for dizziness.  All other systems reviewed and are negative.  Physical Exam Updated Vital Signs BP (!) 136/99 (BP Location: Right Arm)   Pulse 88   Temp 98.1 F (36.7 C) (Oral)   Resp 20   Ht _0  (1.651 m)   Wt 77.1 kg   SpO2 100%   BMI 28.29 kg/m   Physical Exam Vitals and nursing note reviewed.  Constitutional:      General: She is not in acute distress.    Appearance: She is well-developed. She is not diaphoretic.  HENT:     Head: Normocephalic and atraumatic.     Mouth/Throat:      Mouth: Mucous membranes are moist.  Eyes:     Extraocular Movements: Extraocular movements intact.     Pupils: Pupils are equal, round, and reactive to light.  Cardiovascular:     Rate and Rhythm: Normal rate and regular rhythm.     Heart sounds: No murmur heard.   No friction rub. No gallop.  Pulmonary:     Effort: Pulmonary effort is normal. No respiratory distress.     Breath sounds: Normal breath sounds. No wheezing.  Abdominal:     General: Bowel sounds are normal. There is no distension.     Palpations: Abdomen is soft.     Tenderness: There is no abdominal tenderness.  Musculoskeletal:        General: Normal range of motion.     Cervical back: Normal range of motion and neck supple.  Skin:    General: Skin is warm and dry.  Neurological:     General: No focal deficit present.     Mental Status: She is alert and oriented to person, place, and time.     Cranial Nerves: No cranial nerve deficit.     Sensory: No sensory deficit.     Motor: No weakness.     Coordination: Coordination normal.    ED Results / Procedures / Treatments   Labs (all labs ordered are listed, but only abnormal results are displayed) Labs Reviewed  BASIC METABOLIC PANEL  CBC WITH DIFFERENTIAL/PLATELET  HCG, SERUM, QUALITATIVE    EKG None  Radiology No results found.  Procedures Procedures   Medications Ordered in ED Medications  sodium chloride 0.9 % bolus 1,000 mL (has no administration in time range)    ED Course  I have reviewed the triage vital signs and the nursing notes.  Pertinent labs & imaging results that were available during my care of the patient were reviewed by me and considered in my medical decision making (see chart for details).    MDM Rules/Calculators/A&P  Patient is a 40 year old female presenting with complaints of headache and dizziness as described in the HPI.  Her symptoms sound most consistent with peripheral vertigo, however she has not had much  relief with meclizine.  She has been given IV fluids along with Toradol and Compazine for her headache.  Work-up here shows unremarkable head CT and laboratory studies which are also unremarkable.  She is neurologically intact with stable vital signs and reassuring work-up.  At this point, I feel as though discharge is appropriate.  She has an appointment today with her primary doctor.  Patient may need referral to vestibular rehab or further imaging at her PCPs discretion.  Final Clinical Impression(s) / ED Diagnoses Final diagnoses:  None    Rx /  DC Orders ED Discharge Orders     None        Veryl Speak, MD 06/01/21 [phone]
